# Patient Record
Sex: Female | Born: 1980 | Race: White | Hispanic: No | Marital: Single | Smoking: Light tobacco smoker
Health system: Southern US, Community
[De-identification: ages and names within clinical notes are randomized; demographics above are authoritative.]

## PROBLEM LIST (undated history)

## (undated) DIAGNOSIS — F419 Anxiety disorder, unspecified: Secondary | ICD-10-CM

## (undated) HISTORY — DX: Anxiety disorder, unspecified: F41.9

## (undated) HISTORY — PX: COLONOSCOPY: SHX174

---

## 2014-08-29 ENCOUNTER — Encounter (HOSPITAL_COMMUNITY): Payer: Self-pay | Admitting: Emergency Medicine

## 2014-08-29 ENCOUNTER — Emergency Department (HOSPITAL_COMMUNITY)
Admission: EM | Admit: 2014-08-29 | Discharge: 2014-08-29 | Disposition: A | Discharge status: Home / Self Care | Payer: Self-pay | Attending: Emergency Medicine | Admitting: Emergency Medicine

## 2014-08-29 DIAGNOSIS — M545 Low back pain, unspecified: Secondary | ICD-10-CM

## 2014-08-29 DIAGNOSIS — Z880 Allergy status to penicillin: Secondary | ICD-10-CM | POA: Insufficient documentation

## 2014-08-29 DIAGNOSIS — Y9289 Other specified places as the place of occurrence of the external cause: Secondary | ICD-10-CM | POA: Insufficient documentation

## 2014-08-29 DIAGNOSIS — IMO0002 Reserved for concepts with insufficient information to code with codable children: Secondary | ICD-10-CM | POA: Insufficient documentation

## 2014-08-29 DIAGNOSIS — J028 Acute pharyngitis due to other specified organisms: Secondary | ICD-10-CM

## 2014-08-29 DIAGNOSIS — Y93F2 Activity, caregiving, lifting: Secondary | ICD-10-CM | POA: Insufficient documentation

## 2014-08-29 DIAGNOSIS — B9689 Other specified bacterial agents as the cause of diseases classified elsewhere: Secondary | ICD-10-CM

## 2014-08-29 DIAGNOSIS — J029 Acute pharyngitis, unspecified: Secondary | ICD-10-CM | POA: Insufficient documentation

## 2014-08-29 DIAGNOSIS — S39012A Strain of muscle, fascia and tendon of lower back, initial encounter: Secondary | ICD-10-CM

## 2014-08-29 DIAGNOSIS — X500XXA Overexertion from strenuous movement or load, initial encounter: Secondary | ICD-10-CM | POA: Insufficient documentation

## 2014-08-29 LAB — RAPID STREP SCREEN (MED CTR MEBANE ONLY): Streptococcus, Group A Screen (Direct): NEGATIVE

## 2014-08-29 MED ORDER — DEXAMETHASONE SODIUM PHOSPHATE 10 MG/ML IJ SOLN
10.0000 mg | Freq: Once | INTRAMUSCULAR | Status: AC
  Administered 2014-08-29: 10 mg via INTRAMUSCULAR
  Filled 2014-08-29: qty 1

## 2014-08-29 MED ORDER — AZITHROMYCIN 250 MG PO TABS
500.0000 mg | ORAL_TABLET | Freq: Once | ORAL | Status: AC
  Administered 2014-08-29: 500 mg via ORAL
  Filled 2014-08-29: qty 2

## 2014-08-29 MED ORDER — METHOCARBAMOL 500 MG PO TABS: 500.0000 mg | ORAL_TABLET | Freq: Two times a day (BID) | ORAL | Status: DC

## 2014-08-29 MED ORDER — AZITHROMYCIN 250 MG PO TABS: 250.0000 mg | ORAL_TABLET | Freq: Every day | ORAL | Status: DC

## 2014-08-29 NOTE — Discharge Instructions (Signed)
1. Medications: azithromycin, robaxin, usual home medications 2. Treatment: rest, drink plenty of fluids, ibuprofen and tylenol for pain and fever control 3. Follow Up: Please followup with your primary doctor in 3 days for discussion of your diagnoses and further evaluation after today's visit; if you do not have a primary care doctor use the resource guide provided to find one; return to the emergency department for difficulty swallowing, difficulty breathing, high fevers or other concerns   Back Exercises Back exercises help treat and prevent back injuries. The goal of back exercises is to increase the strength of your abdominal and back muscles and the flexibility of your back. These exercises should be started when you no longer have back pain. Back exercises include:  Pelvic Tilt. Lie on your back with your knees bent. Tilt your pelvis until the lower part of your back is against the floor. Hold this position 5 to 10 sec and repeat 5 to 10 times.  Knee to Chest. Pull first 1 knee up against your chest and hold for 20 to 30 seconds, repeat this with the other knee, and then both knees. This may be done with the other leg straight or bent, whichever feels better.  Sit-Ups or Curl-Ups. Bend your knees 90 degrees. Start with tilting your pelvis, and do a partial, slow sit-up, lifting your trunk only 30 to 45 degrees off the floor. Take at least 2 to 3 seconds for each sit-up. Do not do sit-ups with your knees out straight. If partial sit-ups are difficult, simply do the above but with only tightening your abdominal muscles and holding it as directed.  Hip-Lift. Lie on your back with your knees flexed 90 degrees. Push down with your feet and shoulders as you raise your hips a couple inches off the floor; hold for 10 seconds, repeat 5 to 10 times.  Back arches. Lie on your stomach, propping yourself up on bent elbows. Slowly press on your hands, causing an arch in your low back. Repeat 3 to 5 times.  Any initial stiffness and discomfort should lessen with repetition over time.  Shoulder-Lifts. Lie face down with arms beside your body. Keep hips and torso pressed to floor as you slowly lift your head and shoulders off the floor. Do not overdo your exercises, especially in the beginning. Exercises may cause you some mild back discomfort which lasts for a few minutes; however, if the pain is more severe, or lasts for more than 15 minutes, do not continue exercises until you see your caregiver. Improvement with exercise therapy for back problems is slow.  See your caregivers for assistance with developing a proper back exercise program. Document Released: 01/01/2005 Document Revised: 02/16/2012 Document Reviewed: 09/25/2011 [facility] Patient Information 2015 [place], [place]. This information is not intended to replace advice given to you by your health care provider. Make sure you discuss any questions you have with your health care provider.   Pharyngitis Pharyngitis is redness, pain, and swelling (inflammation) of your pharynx.  CAUSES  Pharyngitis is usually caused by infection. Most of the time, these infections are from viruses (viral) and are part of a cold. However, sometimes pharyngitis is caused by bacteria (bacterial). Pharyngitis can also be caused by allergies. Viral pharyngitis may be spread from person to person by coughing, sneezing, and personal items or utensils (cups, forks, spoons, toothbrushes). Bacterial pharyngitis may be spread from person to person by more intimate contact, such as kissing.  SIGNS AND SYMPTOMS  Symptoms of pharyngitis include:  Sore throat.   Tiredness (fatigue).   Low-grade fever.   Headache.  Joint pain and muscle aches.  Skin rashes.  Swollen lymph nodes.  Plaque-like film on throat or tonsils (often seen with bacterial pharyngitis). DIAGNOSIS  Your health care provider will ask you questions about your illness and your symptoms. Your  medical history, along with a physical exam, is often all that is needed to diagnose pharyngitis. Sometimes, a rapid strep test is done. Other lab tests may also be done, depending on the suspected cause.  TREATMENT  Viral pharyngitis will usually get better in 3-4 days without the use of medicine. Bacterial pharyngitis is treated with medicines that kill germs (antibiotics).  HOME CARE INSTRUCTIONS   Drink enough water and fluids to keep your urine clear or pale yellow.   Only take over-the-counter or prescription medicines as directed by your health care provider:   If you are prescribed antibiotics, make sure you finish them even if you start to feel better.   Do not take aspirin.   Get lots of rest.   Gargle with 8 oz of salt water ( tsp of salt per 1 qt of water) as often as every 1-2 hours to soothe your throat.   Throat lozenges (if you are not at risk for choking) or sprays may be used to soothe your throat. SEEK MEDICAL CARE IF:   You have large, tender lumps in your neck.  You have a rash.  You cough up green, yellow-brown, or bloody spit. SEEK IMMEDIATE MEDICAL CARE IF:   Your neck becomes stiff.  You drool or are unable to swallow liquids.  You vomit or are unable to keep medicines or liquids down.  You have severe pain that does not go away with the use of recommended medicines.  You have trouble breathing (not caused by a stuffy nose). MAKE SURE YOU:   Understand these instructions.  Will watch your condition.  Will get help right away if you are not doing well or get worse. Document Released: 11/24/2005 Document Revised: 09/14/2013 Document Reviewed: 08/01/2013 [facility] Patient Information 2015 [place], [place]. This information is not intended to replace advice given to you by your health care provider. Make sure you discuss any questions you have with your health care provider.     Emergency Department Resource Guide 1) Find a Doctor and Pay  Out of Pocket Although you won't have to find out who is covered by your insurance plan, it is a good idea to ask around and get recommendations. You will then need to call the office and see if the doctor you have chosen will accept you as a new patient and what types of options they offer for patients who are self-pay. Some doctors offer discounts or will set up payment plans for their patients who do not have insurance, but you will need to ask so you aren't surprised when you get to your appointment.  2) Contact Your Local Health Department Not all health departments have doctors that can see patients for sick visits, but many do, so it is worth a call to see if yours does. If you don't know where your local health department is, you can check in your phone book. The CDC also has a tool to help you locate your state's health department, and many state websites also have listings of all of their local health departments.  3) Find a Walk-in Clinic If your illness is not likely to be very severe or complicated,  you may want to try a walk in clinic. These are popping up all over the country in pharmacies, drugstores, and shopping centers. They're usually staffed by nurse practitioners or physician assistants that have been trained to treat common illnesses and complaints. They're usually fairly quick and inexpensive. However, if you have serious medical issues or chronic medical problems, these are probably not your best option.  No Primary Care Doctor: - Call Health Connect at  [phone] - they can help you locate a primary care doctor that  accepts your insurance, provides certain services, etc. - Physician Referral Service- [phone]  Chronic Pain Problems: Organization         Address  Phone   Notes  [facility]  [phone] Patients need to be referred by their primary care doctor.   Medication Assistance: Organization         Address  Phone   Notes  [facility]  Medication [facility] [address] [place] [place], [place] [address] [phone] --Must be a resident of [facility] -- Must have NO insurance coverage whatsoever (no Medicaid/ Medicare, etc.) -- The pt. MUST have a primary care doctor that directs their care regularly and follows them in the community   [facility]  [phone]   [employer]  [phone]    Agencies that provide inexpensive medical care: Organization         Address  Phone   Notes  [facility]  [phone]   [facility]    [phone]   [facility] [address] [place], [place] [address] [phone]   [facility] [address], [place] [phone]   Planned Parenthood    [phone]   [facility]    [phone]   [facility]  [address], [place] Phone:  [phone], Fax:  [phone] Hours of Operation:  9 am - 6 pm, M-F.  Also accepts Medicaid/Medicare and self-pay.  [facility]  [address], [place] Phone: [phone], Fax: [phone]. Hours of Operation:  8:30 am - 5:30 pm, M-F.  Also accepts Medicaid and self-pay.  [facility] [place] [address] [place] Phone: [phone]   [facility] [address] Natasha Bence [place], [place] [phone], Ext. [phone] Mondays & Thursdays: 7-9 AM.  First 15 patients are seen on a first come, first serve basis.    Medicaid-accepting [facility] Providers:  Organization         Address  Phone   Notes  [facility] [address], [place] [phone] Also accepts self-pay patients.  [facility] [address] Laurell Josephs [place], [place]  [phone]   [facility] [address], [place] [phone]   [facility] Family Medicine [address] [place] [phone]   Renaye Rakers [address], [place]   [phone] Only accepts [place] Access [place] patients after they have their name applied to their card.   Self-Pay (no insurance) in [facility]:  Organization         Address  Phone   Notes  Sickle Cell Patients, [facility] Internal Medicine [address] [place], [place] [phone]   [facility] Care [address] [place],  [place] [phone]   [facility] [employer]  [address], [place] [phone]   [facility]/Dr. Osei-Bonsu  [address], [place] or [address], [place] [phone] Phone number for both [place] and [place] locations is the same.  [facility] [address], [place] [phone]   [facility] [address] [place] or [address] [phone]   [facility] [address], [place] [phone], phone; [phone], fax Sees patients 1st and 3rd Saturday of every month.  Must not qualify for public or private insurance (i.e. Medicaid, Medicare, [place] Health Choice, Veterans' Benefits)  Household income should be no more than 200% of the poverty level The clinic cannot treat you if you are pregnant or think you are pregnant  Sexually transmitted diseases are not treated at the clinic.    Dental Care: Organization         Address  Phone  Notes  [facility] [address] [place], [place] [phone] Accepts children up to age 13 who are enrolled in [place] or [place] Health Choice; pregnant women with a Medicaid card; and children who have applied for Medicaid or [place] Health Choice, but were declined, whose parents can pay a reduced fee at time of service.  [facility] Department of [facility]  [address], [place]  [phone] Accepts children up to age 47 who are enrolled in [place] or [place] Health Choice; pregnant women with a Medicaid card; and children who have applied for Medicaid or [place] Health Choice, but were declined, whose parents can pay a reduced fee at time of service.  [facility]  [address] [place], [place] [phone] Patients are seen by appointment only. Walk-ins are not accepted. [facility] will see patients 33 years of age and older. Monday - Tuesday (8am-5pm) Most Wednesdays (8:30-5pm) $30 per visit, cash only  [facility] Adult Dental Access PRO[facility]  [address], [facility] [phone] Patients are seen by appointment only. Walk-ins are not accepted. [facility] will see patients 4 years of age and older. One Wednesday Evening (Monthly: Volunteer Based).  $30 per visit, cash only  [employer]  [phone] for adults; Children under age 38, call Graduate Pediatric Dentistry at [phone]. Children aged 32-14, please call [phone] to request a pediatric application.  Dental services are provided in all areas of dental care including fillings, crowns and bridges, complete and partial dentures, implants, gum treatment, root canals, and extractions. Preventive care is also provided. Treatment is provided to both adults and children. Patients are selected via a lottery and there is often a waiting list.   [facility] [address], [place]  [phone] [email or web address]   [facility] [address] [place], [place] [phone], Ext. [phone] Second and Fourth Thursday of each month, opens at 6:30 AM; Clinic ends at 9 AM.  Patients are seen on a first-come first-served basis, and a limited number are seen during each clinic.   [facility]  [address] Ether Griffins [place], [place] [phone]   Eligibility Requirements You must have lived in [place], [place], or [place]  counties for at least the last three months.   You cannot be eligible  for state or [employer], including [employer], [place], or [employer].   You generally cannot be eligible for healthcare insurance through your employer.    How to apply: Eligibility screenings are held every Tuesday and Wednesday afternoon from 1:00 pm until 4:00 pm. You do not need an appointment for the interview!  [facility] [address], [place], [place] [phone]   [facility]  [phone]   [facility] Health Department  [phone]   [facility] Health Department  [phone]    Behavioral Health Resources in the Community: Intensive Outpatient Programs Organization         Address  Phone  Notes  [facility] Services [address], [place], [place] [phone]   [facility] Outpatient [address] [place], [place] [phone]   [facility] [address] [place], [place]  [phone]   [facility] Mental Health [address],  [place], [place] [phone] or [phone]   Substance Abuse Resources Organization         Address  Phone  Notes  [facility]  [phone]   [facility]  [phone]   [facility] [place]  [phone]   Floydene Flock  [phone]   Residential & Outpatient Substance Abuse Program  [phone]   Psychological Services Organization         Address  Phone  Notes  [facility] Behavioral Health  [phone]   [facility]  [phone]   [facility] Mental Health [address] [place] [phone] or [phone]    Mobile Crisis Teams Organization         Address  Phone  Notes  [facility], Mobile Crisis Care Unit  [phone]   [facility]  [address] [place], [place] [phone]   Doristine Locks [address] [place]  [place] [phone]    Self-Help/Support Groups Organization         Address  Phone             Notes  [facility] [place] - variety of support groups  336- I7437963 Call for more information  Narcotics Anonymous (NA), [facility] [address], [employer] [place]  2 meetings at this location   Statistician         Address  Phone  Notes  [facility] [address],    [place]  [phone]   [facility]  [address], [place] [address], [place], [place] [phone]   [facility] [address] [place], [place] [phone] Admissions: 8am-3pm M-F  [facility] [address],    [place], [place] [phone]   [facility] [address] Starling Manns [place], [place] [phone]   [facility] [address],  [place], [place] [phone]   [facility] - Intensive Outpatient [address] Laurell Joseph[address], [place], [place] [phone]   [facility] (Addiction [facility] [address] [place]  [place], [place] [phone] or [phone]   [facility] ([facility]) [address], [place], [place] [phone] Accepts Medicaid  [facility] [address],  [place] [phone] Substance Abuse/Addiction Treatment   [facility] Organization         Address  Phone  Notes  [facility]  [phone]   Angie Fava, PhD [address] [place], [place]   (  336) X3202989 or [phone]) [phone]   [facility]   [address] [place], [place] [phone]   [facility] Recovery [address] [place], [place] [phone] Insurance/Medicaid/sponsorship through [facility] and Families [address]                                    [place], [place] [phone] Therapy/tele-psych/case  [facility] [address]   [place], [place] [phone]    Dr. Lolly Mustache  [phone]   [facility]  [employer] [facility] Dept. 1) [address],  2) [address], [place] 3)  [address], [place] [phone]  [phone]   [facility] Child Abuse Hotline 714-497-3075 or [phone] (After Hours)

## 2014-08-29 NOTE — ED Provider Notes (Signed)
CSN: [id number]     Arrival date & time 08/29/14  2143 History   First MD Initiated Contact with Patient 08/29/14 2253     Chief Complaint  Patient presents with  . Sore Throat     (Consider location/radiation/quality/duration/timing/severity/associated sxs/prior Treatment) The history is provided by the patient and medical records. No language interpreter was used.    Lisa Lindsey is a 33 y.o. female  with no known medical Hx presents to the Emergency Department complaining of gradual, persistent, progressively worsening sore throat onset 3 days ago. Associated symptoms include otalgia, nasal congestion, chills.  Pt reports three small children at home with URIs.  Nothing makes it better and nothing makes it worse.  Pt denies fever, neck pain, chest pain, SOB, abd pain, N/V/D, weakness, dizziness, syncope.    Pt also c/o low back pain.  She was attempting to lift her mother and felt something pull in her back.  She reports she has been taking advil with moderate relief.  She denies loss of bowel or bladder control, saddle anesthesia or gait disturbance.  Denies hx of cancer, IVDU, anticoagulant use.     History reviewed. No pertinent past medical history. History reviewed. No pertinent past surgical history. History reviewed. No pertinent family history. History  Substance Use Topics  . Smoking status: Not on file  . Smokeless tobacco: Not on file  . Alcohol Use: Not on file   OB History   Grav Para Term Preterm Abortions TAB SAB Ect Mult Living                 Review of Systems  Constitutional: Negative for fever, chills and fatigue.  HENT: Positive for ear pain and sore throat.   Eyes: Negative for visual disturbance.  Respiratory: Negative for chest tightness and shortness of breath.   Cardiovascular: Negative for chest pain.  Gastrointestinal: Negative for nausea, vomiting, abdominal pain and diarrhea.  Endocrine: Negative for polydipsia, polyphagia and polyuria.   Genitourinary: Negative for dysuria, urgency, frequency and hematuria.  Musculoskeletal: Positive for back pain and gait problem ( 2/2 pain). Negative for joint swelling, neck pain and neck stiffness.  Skin: Negative for rash.  Allergic/Immunologic: Negative for immunocompromised state.  Neurological: Negative for weakness, light-headedness, numbness and headaches.  All other systems reviewed and are negative.     Allergies  Penicillins  Home Medications   Prior to Admission medications   Medication Sig Start Date End Date Taking? Authorizing Provider  azithromycin (ZITHROMAX) 250 MG tablet Take 1 tablet (250 mg total) by mouth daily. Take first 2 tablets together, then 1 every day until finished. 08/29/14   Zymiere Trostle, PA-C  methocarbamol (ROBAXIN) 500 MG tablet Take 1 tablet (500 mg total) by mouth 2 (two) times daily. 08/29/14   Nasri Boakye, PA-C   BP 126/70  Pulse 76  Temp(Src) 98.6 F (37 C) (Oral)  Resp 19  SpO2 100% Physical Exam  Nursing note and vitals reviewed. Constitutional: She appears well-developed and well-nourished. No distress.  HENT:  Head: Normocephalic and atraumatic.  Right Ear: Tympanic membrane, external ear and ear canal normal.  Left Ear: Tympanic membrane, external ear and ear canal normal.  Nose: Nose normal. No mucosal edema or rhinorrhea.  Mouth/Throat: Uvula is midline and mucous membranes are normal. Mucous membranes are not dry. No trismus in the jaw. No uvula swelling. Oropharyngeal exudate, posterior oropharyngeal edema and posterior oropharyngeal erythema present. No tonsillar abscesses.  Posterior oropharynx with erythema, edema and exudate on the tonsils  Eyes: Conjunctivae and EOM are normal. Pupils are equal, round, and reactive to light. No scleral icterus.  Neck: Normal range of motion, full passive range of motion without pain and phonation normal. Neck supple. No tracheal tenderness, no spinous process tenderness and no  muscular tenderness present. No rigidity. No erythema and normal range of motion present. No Brudzinski's sign and no Kernig's sign noted.  Range of motion without pain no No midline or paraspinal tenderness Normal phonation No stridor Handling secretions without difficulty No nuchal rigidity or meningeal signs  Cardiovascular: Normal rate, regular rhythm, normal heart sounds and intact distal pulses.   No murmur heard. Pulses:      Radial pulses are 2+ on the right side, and 2+ on the left side.  Pulmonary/Chest: Effort normal and breath sounds normal. No stridor. No respiratory distress. She has no decreased breath sounds. She has no wheezes.  Equal chest expansion, clear and equal breath sounds without focal wheezes, rhonchi or rales  Abdominal: Soft. She exhibits no distension. There is no tenderness.  Musculoskeletal: Normal range of motion.  Full range of motion of the T-spine and L-spine No tenderness to palpation of the spinous processes of the T-spine or L-spine Mild tenderness to palpation of the right paraspinous muscles of the L-spine  Lymphadenopathy:       Head (right side): Submandibular and tonsillar adenopathy present. No submental, no preauricular, no posterior auricular and no occipital adenopathy present.       Head (left side): Submandibular and tonsillar adenopathy present. No submental, no preauricular, no posterior auricular and no occipital adenopathy present.    She has cervical adenopathy (anterior, bilateral).       Right cervical: No superficial cervical, no deep cervical and no posterior cervical adenopathy present.      Left cervical: No superficial cervical, no deep cervical and no posterior cervical adenopathy present.  Neurological: She is alert. She has normal reflexes. She exhibits normal muscle tone. Coordination normal.  Speech is clear and goal oriented, follows commands Normal 5/5 strength in upper and lower extremities bilaterally including  dorsiflexion and plantar flexion, strong and equal grip strength Sensation normal to light and sharp touch Moves extremities without ataxia, coordination intact Normal gait Normal balance   Skin: Skin is warm and dry. No rash noted. She is not diaphoretic. No erythema.  Psychiatric: She has a normal mood and affect. Her behavior is normal.    ED Course  Procedures (including critical care time) Labs Review Labs Reviewed  RAPID STREP SCREEN  CULTURE, GROUP A STREP    Imaging Review No results found.   EKG Interpretation None      MDM   Final diagnoses:  Bacterial pharyngitis  Right-sided low back pain without sciatica  Lumbar strain, initial encounter   Minna Freels presents with hx and PE consistent with bacterial pharyngitis and low back strain.    Pt febrile with tonsillar exudate, cervical lymphadenopathy, & dysphagia; diagnosis of arterial pharyngitis. Treated in the ED with steroids, NSAIDs, Pain medication and discharged with azithromycin.  Pt appears mildly dehydrated, discussed importance of water rehydration. Presentation non concerning for PTA or infxn spread to soft tissue. No trismus or uvula deviation. Specific return precautions discussed. Pt able to drink water in ED without difficulty with intact air way.   Patient with back pain.  No neurological deficits and normal neuro exam.  Patient can walk but states is painful.  No loss of bowel or bladder control.  No concern for cauda  equina.  No fever, night sweats, weight loss, h/o cancer, IVDU.  RICE protocol and muscle relaxers indicated and discussed with patient.    Recommend primary care followup  BP 126/70  Pulse 76  Temp(Src) 98.6 F (37 C) (Oral)  Resp 19  SpO2 100%     Dierdre Forth, PA-C 08/29/14 2336

## 2014-08-29 NOTE — ED Notes (Signed)
Pt also wants to be seen for back pain.  Pt was pulling her mother out of bed yesterday and felt back hurting.

## 2014-08-29 NOTE — ED Notes (Signed)
Pt reports yellow patches and reddened throat and throat pain starting Sunday. Denies fevers.

## 2014-08-30 NOTE — Progress Notes (Signed)
  CARE MANAGEMENT ED NOTE 08/30/2014  Patient:  Lisa Lindsey   Account Number:  [id number]  Date Initiated:  08/30/2014  Documentation initiated by:  Radford Pax  Subjective/Objective Assessment:   Patient presents to [facility] Ed with sore throat worsening for three days.  Patient has three small children at home with upper respiratoey infections.     Subjective/Objective Assessment Detail:     Action/Plan:   Action/Plan Detail:   Anticipated DC Date:  08/30/2014     Status Recommendation to Physician:   Result of Recommendation:      DC Planning Services  CM consult  Other  Medication Assistance    Choice offered to / List presented to:            Status of service:  Completed, signed off  ED Comments:   ED Comments Detail:  [facility] received phone call from patient requesting assistance with prescription cost.  Patient reports she went to [place] pharmacy and meication cost  fourty five dollars. Patient unable to afford prescription cost.  [facility] entered patient into the [facility] program.  [facility] explained to patient that she has seven days to fill her prescription, otherwise the [facility] letter will expire.  Also informed patient that this is a one time assist and she will not be eligible for this assistance until this time next year Sept 2016. Patient is agreeable to three dollar copay. Patient wants to get her RX filled at [place] [facility] [address]. [facility] provided patient with phone number and adress to [facility] [phone].  [facility] called [facility] pharmacy and received FAX number [phone].  [facility] faxed MATCH letter to [facility] at 1813pm with confirmation of receipt at 1814pm.  [facility] informed patient of above.  No further EDCM needs at this time.

## 2014-08-30 NOTE — ED Provider Notes (Signed)
Medical screening examination/treatment/procedure(s) were performed by non-physician practitioner and as supervising physician I was immediately available for consultation/collaboration.   EKG Interpretation None      Devoria Albe, MD, Armando Gang   Ward Givens, MD 08/30/14 [phone]

## 2014-08-30 NOTE — Progress Notes (Signed)
08/30/2014 A. Lisa Lindsey RNCM 1850pm EDCM called [facility] pharmacy who confirms they have received MATCH letter.  Per previos converstion with patient, she reported her pcp is located at [facility] in [place].  System updated.  [facility] encouraged patient to make follow up appointment with her pcp.  Patient verbalized understanding.  No further EDCM needs at this time.

## 2014-09-01 LAB — CULTURE, GROUP A STREP

## 2015-02-06 ENCOUNTER — Emergency Department (HOSPITAL_COMMUNITY): Payer: Medicaid Other

## 2015-02-06 ENCOUNTER — Inpatient Hospital Stay (HOSPITAL_COMMUNITY): Payer: Medicaid Other

## 2015-02-06 ENCOUNTER — Inpatient Hospital Stay (HOSPITAL_COMMUNITY)
Admission: EM | Admit: 2015-02-06 | Discharge: 2015-02-10 | DRG: 871 | Disposition: A | Discharge status: Home / Self Care | Payer: Medicaid Other | Attending: Internal Medicine | Admitting: Internal Medicine

## 2015-02-06 DIAGNOSIS — R112 Nausea with vomiting, unspecified: Secondary | ICD-10-CM

## 2015-02-06 DIAGNOSIS — Z6841 Body Mass Index (BMI) 40.0 and over, adult: Secondary | ICD-10-CM | POA: Diagnosis not present

## 2015-02-06 DIAGNOSIS — E860 Dehydration: Secondary | ICD-10-CM | POA: Diagnosis present

## 2015-02-06 DIAGNOSIS — R6521 Severe sepsis with septic shock: Secondary | ICD-10-CM | POA: Diagnosis present

## 2015-02-06 DIAGNOSIS — A084 Viral intestinal infection, unspecified: Secondary | ICD-10-CM | POA: Diagnosis present

## 2015-02-06 DIAGNOSIS — R1010 Upper abdominal pain, unspecified: Secondary | ICD-10-CM

## 2015-02-06 DIAGNOSIS — R109 Unspecified abdominal pain: Secondary | ICD-10-CM

## 2015-02-06 DIAGNOSIS — A09 Infectious gastroenteritis and colitis, unspecified: Secondary | ICD-10-CM

## 2015-02-06 DIAGNOSIS — A419 Sepsis, unspecified organism: Secondary | ICD-10-CM | POA: Diagnosis present

## 2015-02-06 DIAGNOSIS — R509 Fever, unspecified: Secondary | ICD-10-CM

## 2015-02-06 DIAGNOSIS — R197 Diarrhea, unspecified: Secondary | ICD-10-CM

## 2015-02-06 DIAGNOSIS — Z452 Encounter for adjustment and management of vascular access device: Secondary | ICD-10-CM

## 2015-02-06 DIAGNOSIS — E876 Hypokalemia: Secondary | ICD-10-CM | POA: Diagnosis present

## 2015-02-06 DIAGNOSIS — N179 Acute kidney failure, unspecified: Secondary | ICD-10-CM | POA: Diagnosis present

## 2015-02-06 DIAGNOSIS — E869 Volume depletion, unspecified: Secondary | ICD-10-CM | POA: Diagnosis present

## 2015-02-06 DIAGNOSIS — N170 Acute kidney failure with tubular necrosis: Secondary | ICD-10-CM | POA: Diagnosis present

## 2015-02-06 LAB — URINALYSIS, ROUTINE W REFLEX MICROSCOPIC
Glucose, UA: 100 mg/dL — AB
Ketones, ur: 15 mg/dL — AB
NITRITE: POSITIVE — AB
PH: 5 (ref 5.0–8.0)
PROTEIN: 100 mg/dL — AB
SPECIFIC GRAVITY, URINE: 1.024 (ref 1.005–1.030)
Urobilinogen, UA: 1 mg/dL (ref 0.0–1.0)

## 2015-02-06 LAB — COMPREHENSIVE METABOLIC PANEL
ALK PHOS: 79 U/L (ref 39–117)
ALT: 27 U/L (ref 0–35)
AST: 74 U/L — ABNORMAL HIGH (ref 0–37)
Albumin: 3.4 g/dL — ABNORMAL LOW (ref 3.5–5.2)
Anion gap: 13 (ref 5–15)
BILIRUBIN TOTAL: 0.9 mg/dL (ref 0.3–1.2)
BUN: 25 mg/dL — ABNORMAL HIGH (ref 6–23)
CO2: 19 mmol/L (ref 19–32)
Calcium: 8.5 mg/dL (ref 8.4–10.5)
Chloride: 101 mmol/L (ref 96–112)
Creatinine, Ser: 4.23 mg/dL — ABNORMAL HIGH (ref 0.50–1.10)
GFR calc Af Amer: 15 mL/min — ABNORMAL LOW (ref >90)
GFR calc non Af Amer: 13 mL/min — ABNORMAL LOW (ref >90)
GLUCOSE: 119 mg/dL — AB (ref 70–99)
POTASSIUM: 3.5 mmol/L (ref 3.5–5.1)
Sodium: 133 mmol/L — ABNORMAL LOW (ref 135–145)
Total Protein: 7.3 g/dL (ref 6.0–8.3)

## 2015-02-06 LAB — I-STAT CG4 LACTIC ACID, ED
LACTIC ACID, VENOUS: 3.05 mmol/L — AB (ref 0.5–2.0)
Lactic Acid, Venous: 4.28 mmol/L (ref 0.5–2.0)

## 2015-02-06 LAB — PROCALCITONIN

## 2015-02-06 LAB — CBC
HCT: 40.4 % (ref 36.0–46.0)
Hemoglobin: 13.4 g/dL (ref 12.0–15.0)
MCH: 27.7 pg (ref 26.0–34.0)
MCHC: 33.2 g/dL (ref 30.0–36.0)
MCV: 83.5 fL (ref 78.0–100.0)
PLATELETS: 191 10*3/uL (ref 150–400)
RBC: 4.84 MIL/uL (ref 3.87–5.11)
RDW: 15.9 % — ABNORMAL HIGH (ref 11.5–15.5)
WBC: 20.9 10*3/uL — AB (ref 4.0–10.5)

## 2015-02-06 LAB — BASIC METABOLIC PANEL
ANION GAP: 12 (ref 5–15)
BUN: 24 mg/dL — ABNORMAL HIGH (ref 6–23)
CALCIUM: 7 mg/dL — AB (ref 8.4–10.5)
CO2: 18 mmol/L — ABNORMAL LOW (ref 19–32)
CREATININE: 3.84 mg/dL — AB (ref 0.50–1.10)
Chloride: 106 mmol/L (ref 96–112)
GFR calc Af Amer: 16 mL/min — ABNORMAL LOW (ref >90)
GFR, EST NON AFRICAN AMERICAN: 14 mL/min — AB (ref >90)
Glucose, Bld: 95 mg/dL (ref 70–99)
Potassium: 3.3 mmol/L — ABNORMAL LOW (ref 3.5–5.1)
Sodium: 136 mmol/L (ref 135–145)

## 2015-02-06 LAB — LIPASE, BLOOD: Lipase: 21 U/L (ref 11–59)

## 2015-02-06 LAB — URINE MICROSCOPIC-ADD ON

## 2015-02-06 LAB — POC URINE PREG, ED: PREG TEST UR: NEGATIVE

## 2015-02-06 MED ORDER — SODIUM CHLORIDE 0.9 % IV BOLUS (SEPSIS)
1000.0000 mL | Freq: Once | INTRAVENOUS | Status: AC
  Administered 2015-02-06: 1000 mL via INTRAVENOUS

## 2015-02-06 MED ORDER — LEVOFLOXACIN IN D5W 750 MG/150ML IV SOLN
750.0000 mg | Freq: Once | INTRAVENOUS | Status: AC
  Administered 2015-02-06: 750 mg via INTRAVENOUS
  Filled 2015-02-06: qty 150

## 2015-02-06 MED ORDER — DEXTROSE 5 % IV SOLN
1.0000 g | Freq: Three times a day (TID) | INTRAVENOUS | Status: DC
  Administered 2015-02-07 – 2015-02-08 (×4): 1 g via INTRAVENOUS
  Filled 2015-02-06 (×6): qty 1

## 2015-02-06 MED ORDER — HEPARIN SODIUM (PORCINE) 5000 UNIT/ML IJ SOLN
5000.0000 [IU] | Freq: Three times a day (TID) | INTRAMUSCULAR | Status: DC
  Administered 2015-02-07 – 2015-02-10 (×12): 5000 [IU] via SUBCUTANEOUS
  Filled 2015-02-06 (×14): qty 1

## 2015-02-06 MED ORDER — PANTOPRAZOLE SODIUM 40 MG IV SOLR
40.0000 mg | Freq: Every day | INTRAVENOUS | Status: DC
  Administered 2015-02-07 – 2015-02-08 (×3): 40 mg via INTRAVENOUS
  Filled 2015-02-06 (×5): qty 40

## 2015-02-06 MED ORDER — METRONIDAZOLE IN NACL 5-0.79 MG/ML-% IV SOLN
500.0000 mg | Freq: Three times a day (TID) | INTRAVENOUS | Status: DC
  Administered 2015-02-07 (×2): 500 mg via INTRAVENOUS
  Filled 2015-02-06 (×3): qty 100

## 2015-02-06 MED ORDER — DEXTROSE 5 % IV SOLN
5.0000 ug/min | INTRAVENOUS | Status: DC
  Administered 2015-02-07: 5 ug/min via INTRAVENOUS
  Administered 2015-02-07: 40 ug/min via INTRAVENOUS
  Filled 2015-02-06 (×2): qty 4

## 2015-02-06 MED ORDER — AZTREONAM 2 G IJ SOLR
2.0000 g | Freq: Once | INTRAMUSCULAR | Status: AC
  Administered 2015-02-06: 2 g via INTRAVENOUS
  Filled 2015-02-06: qty 2

## 2015-02-06 MED ORDER — POTASSIUM CHLORIDE 10 MEQ/50ML IV SOLN
10.0000 meq | INTRAVENOUS | Status: AC
  Administered 2015-02-07 (×3): 10 meq via INTRAVENOUS
  Filled 2015-02-06 (×4): qty 50

## 2015-02-06 MED ORDER — ONDANSETRON 4 MG PO TBDP
4.0000 mg | ORAL_TABLET | Freq: Once | ORAL | Status: AC
  Administered 2015-02-06: 4 mg via ORAL

## 2015-02-06 MED ORDER — VANCOMYCIN HCL IN DEXTROSE 1-5 GM/200ML-% IV SOLN
1000.0000 mg | Freq: Once | INTRAVENOUS | Status: AC
  Administered 2015-02-06: 1000 mg via INTRAVENOUS
  Filled 2015-02-06: qty 200

## 2015-02-06 MED ORDER — PHENYLEPHRINE HCL 10 MG/ML IJ SOLN
0.0000 ug/min | INTRAVENOUS | Status: DC
  Administered 2015-02-06: 50 ug/min via INTRAVENOUS
  Filled 2015-02-06: qty 1

## 2015-02-06 MED ORDER — VANCOMYCIN HCL IN DEXTROSE 750-5 MG/150ML-% IV SOLN
750.0000 mg | INTRAVENOUS | Status: AC
  Filled 2015-02-06: qty 150

## 2015-02-06 MED ORDER — SODIUM CHLORIDE 0.9 % IV SOLN: INTRAVENOUS | Status: DC

## 2015-02-06 MED ORDER — FLUCONAZOLE IN SODIUM CHLORIDE 200-0.9 MG/100ML-% IV SOLN
200.0000 mg | INTRAVENOUS | Status: DC
  Administered 2015-02-07: 200 mg via INTRAVENOUS
  Filled 2015-02-06 (×2): qty 100

## 2015-02-06 MED ORDER — HYDROCORTISONE NA SUCCINATE PF 100 MG IJ SOLR
50.0000 mg | Freq: Four times a day (QID) | INTRAMUSCULAR | Status: DC
  Administered 2015-02-07 – 2015-02-08 (×6): 50 mg via INTRAVENOUS
  Filled 2015-02-06 (×10): qty 1

## 2015-02-06 MED ORDER — SODIUM CHLORIDE 0.9 % IV BOLUS (SEPSIS): 500.0000 mL | INTRAVENOUS | Status: DC | PRN

## 2015-02-06 MED ORDER — LEVOFLOXACIN IN D5W 500 MG/100ML IV SOLN: 500.0000 mg | INTRAVENOUS | Status: DC

## 2015-02-06 MED ORDER — SODIUM CHLORIDE 0.9 % IV SOLN: 250.0000 mL | INTRAVENOUS | Status: DC | PRN

## 2015-02-06 MED ORDER — ONDANSETRON HCL 4 MG/2ML IJ SOLN
4.0000 mg | Freq: Once | INTRAMUSCULAR | Status: DC
  Filled 2015-02-06: qty 2

## 2015-02-06 MED ORDER — VANCOMYCIN HCL 10 G IV SOLR: 1750.0000 mg | INTRAVENOUS | Status: DC

## 2015-02-06 NOTE — Progress Notes (Signed)
ANTIBIOTIC CONSULT NOTE - INITIAL  Pharmacy Consult for Vanco, Levaquin, Aztreonam Indication: rule out sepsis  Allergies  Allergen Reactions  . Penicillins     Patient Measurements: Height: 5\' 7"  (170.2 cm) Weight: 270 lb (122.471 kg) IBW/kg (Calculated) : 61.6 Adjusted Body Weight:    Vital Signs: Temp: 98.7 F (37.1 C) (03/01 1752) Temp Source: Oral (03/01 1752) BP: 94/68 mmHg (03/01 1930) Pulse Rate: 34 (03/01 1853) Intake/Output from previous day:   Intake/Output from this shift:    Labs:  Recent Labs  02/06/15 1813  WBC 20.9*  HGB 13.4  PLT 191  CREATININE 4.23*   Estimated Creatinine Clearance: 25.2 mL/min (by C-G formula based on Cr of 4.23). No results for input(s): VANCOTROUGH, VANCOPEAK, VANCORANDOM, GENTTROUGH, GENTPEAK, GENTRANDOM, TOBRATROUGH, TOBRAPEAK, TOBRARND, AMIKACINPEAK, AMIKACINTROU, AMIKACIN in the last 72 hours.   Microbiology: No results found for this or any previous visit (from the past 720 hour(s)).  Medical History: No past medical history on file.  Medications:  See med rec  Assessment: N/V/D with syncopal episode 34 y/o F presents to ED via EMS with above complaints. Code sepsis. PMH unknown at this time except morbid obesity. Lactic acid 4.28 elevated. WBC 20.9 elevated. Scr 4.23 elevated. AST 74 elevated.    Goal of Therapy:  Vancomycin trough level 15-20 mcg/ml  Plan:  Aztreonam 2g in ED, then 1g IV q8hr Levaquin 750mg  IV in ED, then 500mg  IV q48h Vanco 1g IV in ED. Needs 1750mg  IV q48h. Give extra 750mg  IV now. F/u Renal function to dose/interval adjustments.    Chayna Surratt S. Merilynn Finlandobertson, PharmD, BCPS Clinical Staff Pharmacist Pager [phone]  Misty Stanleyobertson, Tiombe Tomeo Stillinger 02/06/2015,7:52 PM

## 2015-02-06 NOTE — ED Notes (Signed)
Verified abx for allergy compatibility with pharmacy, all rx okay with [facility] allergy.

## 2015-02-06 NOTE — ED Notes (Signed)
Helped pt call husband, [phone].

## 2015-02-06 NOTE — ED Notes (Addendum)
Partner Lisa Lindsey,  Mother-in-law Lisa Lindsey has 3 children, [phone]

## 2015-02-06 NOTE — ED Provider Notes (Addendum)
CSN: [id number]     Arrival date & time 02/06/15  1732 History   First MD Initiated Contact with Patient 02/06/15 1812     Chief Complaint  Patient presents with  . Emesis     (Consider location/radiation/quality/duration/timing/severity/associated sxs/prior Treatment) Patient is a 34 y.o. female presenting with vomiting. The history is provided by the patient.  Emesis Associated symptoms: diarrhea   Associated symptoms: no chills, no headaches and no sore throat   pt with nausea, vomiting, and diarrhea, for the past 2 days. Several episodes of each. Emesis not bloody or bilious. Diarrhea watery, not bloody. Denies hx same. Intermittent mild abd cramping, but no constant or focal abd pain. No dysuria, and urinating less frequently/less amount compared to normal. No vaginal discharge or bleeding. Denies flank or back pain. subj fever. No sweats. No cough, sore throat or uri c/o. No headache. No neck pain or stiffness. No recent abx use,  known bad food ingestion, or travel. +family members w recent gi symptoms. No rash. Today felt generally weak/faint, w ?brief syncopal event while sitting on porch swing - denies fall/injury.     No past medical history on file. No past surgical history on file. No family history on file. History  Substance Use Topics  . Smoking status: Not on file  . Smokeless tobacco: Not on file  . Alcohol Use: Not on file   OB History    No data available     Review of Systems  Constitutional: Negative for chills.  HENT: Negative for sinus pressure and sore throat.   Eyes: Negative for pain, redness and visual disturbance.  Respiratory: Negative for cough and shortness of breath.   Cardiovascular: Negative for chest pain and leg swelling.  Gastrointestinal: Positive for nausea, vomiting and diarrhea.  Endocrine: Negative for polyuria.  Genitourinary: Negative for dysuria, flank pain, vaginal bleeding and vaginal discharge.  Musculoskeletal: Negative for  back pain, neck pain and neck stiffness.  Skin: Negative for rash.  Neurological: Negative for headaches.  Hematological: Does not bruise/bleed easily.  Psychiatric/Behavioral: Negative for confusion.      Allergies  Penicillins  Home Medications   Prior to Admission medications   Medication Sig Start Date End Date Taking? Authorizing Provider  azithromycin (ZITHROMAX) 250 MG tablet Take 1 tablet (250 mg total) by mouth daily. Take first 2 tablets together, then 1 every day until finished. 08/29/14   Hannah Muthersbaugh, PA-C  methocarbamol (ROBAXIN) 500 MG tablet Take 1 tablet (500 mg total) by mouth 2 (two) times daily. 08/29/14   Hannah Muthersbaugh, PA-C   BP 94/68 mmHg  Pulse 34  Temp(Src) 98.7 F (37.1 C) (Oral)  Resp 22  Ht  (1.702 m)  Wt 270 lb (122.471 kg)  BMI 42.28 kg/m2  SpO2 100% Physical Exam  Constitutional: She is oriented to person, place, and time. She appears well-developed. She appears distressed.  Tachycardic.   HENT:  Head: Atraumatic.  Dry mm.   Eyes: Conjunctivae are normal. Pupils are equal, round, and reactive to light. No scleral icterus.  Neck: Normal range of motion. Neck supple. No tracheal deviation present. No thyromegaly present.  No stiffness or rigidity  Cardiovascular: Normal heart sounds and intact distal pulses.   Tachycardic.   Pulmonary/Chest: Effort normal and breath sounds normal. No respiratory distress.  Abdominal: Soft. Normal appearance and bowel sounds are normal. She exhibits no distension and no mass. There is no tenderness. There is no rebound and no guarding.  Genitourinary:  No cva  tenderness  Musculoskeletal: She exhibits no edema.  Neurological: She is alert and oriented to person, place, and time.  Skin: Skin is warm and dry. No rash noted.  No petechia or rash.   Psychiatric: She has a normal mood and affect.  Nursing note and vitals reviewed.   ED Course  Procedures (including critical care time) Labs  Review  Results for orders placed or performed during the hospital encounter of 02/06/15  CBC  Result Value Ref Range   WBC 20.9 (H) 4.0 - 10.5 K/uL   RBC 4.84 3.87 - 5.11 MIL/uL   Hemoglobin 13.4 12.0 - 15.0 g/dL   HCT 16.1 09.6 - 04.5 %   MCV 83.5 78.0 - 100.0 fL   MCH 27.7 26.0 - 34.0 pg   MCHC 33.2 30.0 - 36.0 g/dL   RDW 40.9 (H) 81.1 - 91.4 %   Platelets 191 150 - 400 K/uL  Comprehensive metabolic panel  Result Value Ref Range   Sodium 133 (L) 135 - 145 mmol/L   Potassium 3.5 3.5 - 5.1 mmol/L   Chloride 101 96 - 112 mmol/L   CO2 19 19 - 32 mmol/L   Glucose, Bld 119 (H) 70 - 99 mg/dL   BUN 25 (H) 6 - 23 mg/dL   Creatinine, Ser 7.82 (H) 0.50 - 1.10 mg/dL   Calcium 8.5 8.4 - 95.6 mg/dL   Total Protein 7.3 6.0 - 8.3 g/dL   Albumin 3.4 (L) 3.5 - 5.2 g/dL   AST 74 (H) 0 - 37 U/L   ALT 27 0 - 35 U/L   Alkaline Phosphatase 79 39 - 117 U/L   Total Bilirubin 0.9 0.3 - 1.2 mg/dL   GFR calc non Af Amer 13 (L) >90 mL/min   GFR calc Af Amer 15 (L) >90 mL/min   Anion gap 13 5 - 15  I-Stat CG4 Lactic Acid, ED  Result Value Ref Range   Lactic Acid, Venous 4.28 (HH) 0.5 - 2.0 mmol/L   Comment NOTIFIED PHYSICIAN        EKG Interpretation   Date/Time:  Tuesday February 06 2015 17:58:07 EST Ventricular Rate:  141 PR Interval:  128 QRS Duration: 82 QT Interval:  264 QTC Calculation: 404 R Axis:   100 Text Interpretation:  Sinus tachycardia Rightward axis ST \\T \ T wave  abnormality, consider inferior ischemia Baseline wander Confirmed by  Denton Lank  MD, Caryn Bee ([id number]) on 02/06/2015 6:08:03 PM      MDM   Iv ns boluses. Labs.   bp low, additional iv lines placed, pt w 3 iv lines. additional ivf.  Lactic acid high, wbc elevated, AKI, code sepsis, 30 ml/kg ns, and iv abx ordered.  Recheck abd soft nt. No focal tenderness or peritoneal signs.   Additional fluids. bp slightly improved from prior.   Recheck bp 94/68, pt feels mildly improved.   Reviewed nursing notes and prior  charts for additional history.   CRITICAL CARE  RE hypotension, tachycardia, acute kidney injury, dehydration, vomiting and diarrhea, weakness/syncope, r/o sepsis Performed by: Suzi Roots Total critical care time: 45 Critical care time was exclusive of separately billable procedures and treating other patients. Critical care was necessary to treat or prevent imminent or life-threatening deterioration. Critical care was time spent personally by me on the following activities: development of treatment plan with patient and/or surrogate as well as nursing, discussions with consultants, evaluation of patient's response to treatment, examination of patient, obtaining history from patient or surrogate, ordering and performing treatments and  interventions, ordering and review of laboratory studies, ordering and review of radiographic studies, pulse oximetry and re-evaluation of patient's condition.   Additional fluids.   abd soft mild upper abd tenderness.  No rebound or guarding. Pt denies hx gallstones, no prior abd surgery.   Critical care called for admission - discuss pt, labs incl wbc, lactate, aki, current bp - he will have Dr Tyson AliasFeinstein see in ED and admit to icu.   Recheck pt, no new symptoms.mentating normally. abd soft no peritonitis.   bp had improved to 101/42, then trended back down.  Pt c/o diffuse achiness. Dull headache, frontal. No nuchal rigidity on initial and repeat exams.  On repeat abd exam, increased upper quadrant tenderness, esp ruq. No rebound. Ct abd ordered without contrast given cr and critical illness.  Critical care team w pt, placing central line. Pt awaiting ct - will try to get bedside abd u/s, as ct pending.   After transfer to icu, ED nurse indicates on placing urinary cath earlier, moderate vaginal discharge/irritation.  Pt had no c/o lower abd pain or pelvic tenderness earlier..  I discussed above w icu team, Dr Tyson AliasFeinstein, at 0030, so that Icu care team  could check, r/o pelvic infxn/pid, as part of septic workup.      Suzi RootsKevin E Whyatt Klinger, MD 02/07/15 [phone]

## 2015-02-06 NOTE — H&P (Signed)
PULMONARY / CRITICAL CARE MEDICINE   Name: Lisa Lindsey MRN: [id number] DOB: 10-Jan-1981    ADMISSION DATE:  02/06/2015 CONSULTATION DATE:  02/06/2015  REFERRING MD :  EDP  CHIEF COMPLAINT:  Abd pain  INITIAL PRESENTATION: 34 year old female with several day history of abdominal pain/nausea/vomiting. In ED she was profoundly hypotensive refractory to aggressive IVF resuscitation. PCCM to admit   STUDIES:  CT abd >>  SIGNIFICANT EVENTS:  HISTORY OF PRESENT ILLNESS:  34 year female with no significant PMH presented to [facility] ED complaining of abdominal pain/nausea/vomiting for several days. She has 2 children who were recently sick with GI viral infections, which have since resolved. It started with emesis which was reported to be bloody at times. This eventually progressed to diarrhea. Abdominal pain is generalized with localization to RUQ and some radiation to back. In ED she was found to be profoundly hypotensive despite large volume IVF resuscitation. Lactic acid and SCr elevated. PCCM to admit.  PAST MEDICAL HISTORY :   has no past medical history on file.  has no past surgical history on file. Prior to Admission medications   Medication Sig Start Date End Date Taking? Authorizing Provider  levonorgestrel (MIRENA) 20 MCG/24HR IUD 1 each by Intrauterine route once. June 2015   Yes Historical Provider, MD  azithromycin (ZITHROMAX) 250 MG tablet Take 1 tablet (250 mg total) by mouth daily. Take first 2 tablets together, then 1 every day until finished. Patient not taking: Reported on 02/06/2015 08/29/14   Dahlia Client Muthersbaugh, PA-C  methocarbamol (ROBAXIN) 500 MG tablet Take 1 tablet (500 mg total) by mouth 2 (two) times daily. Patient not taking: Reported on 02/06/2015 08/29/14   Dahlia Client Muthersbaugh, PA-C   Allergies  Allergen Reactions  . Penicillins Other (See Comments)    childhood    FAMILY HISTORY:  has no family status information on file.  SOCIAL HISTORY:    REVIEW OF SYSTEMS:    Bolds are positive  Constitutional: weight loss, gain, night sweats, Fevers, chills, fatigue .  HEENT: headaches, Sore throat, sneezing, nasal congestion, post nasal drip, Difficulty swallowing, Tooth/dental problems, visual complaints visual changes, ear ache CV:  chest pain, radiates: ,Orthopnea, PND, swelling in lower extremities, dizziness, palpitations, syncope.  GI  heartburn, indigestion, abdominal pain, nausea, vomiting, diarrhea, change in bowel habits, loss of appetite, bloody stools.  Resp: cough, productive: , hemoptysis, dyspnea, chest pain, pleuritic.  Skin: rash or itching or icterus GU: dysuria, change in color of urine, urgency or frequency. flank pain, hematuria  MS: joint pain or swelling. decreased range of motion  Psych: change in mood or affect. depression or anxiety.  Neuro: difficulty with speech, weakness, numbness, ataxia    SUBJECTIVE:   VITAL SIGNS: Temp:  [98.7 F (37.1 C)-103.7 F (39.8 C)] 103.7 F (39.8 C) (03/01 1955) Pulse Rate:  [34-143] 130 (03/01 2205) Resp:  [18-34] 22 (03/01 2205) BP: (52-113)/(28-68) 76/43 mmHg (03/01 2205) SpO2:  [94 %-100 %] 99 % (03/01 2205) Weight:  [122.471 kg (270 lb)-260.818 kg (575 lb)] 122.471 kg (270 lb) (03/01 1856) HEMODYNAMICS:   VENTILATOR SETTINGS:   INTAKE / OUTPUT: No intake or output data in the 24 hours ending 02/06/15 2219  PHYSICAL EXAMINATION: General:  Obese female in distress  Neuro:  Alert, oriented, non focal HEENT:  [place]/AT, PERRL, no JVD noted Cardiovascular:  RRR, no MRG Lungs:  Clear bilateral breath sounds , tachypnea Abdomen:  Soft, diffusely tender with guarding, non-distended, no rebound Musculoskeletal:  No acute deformity Skin:  Grossly intact  LABS:  CBC  Recent Labs Lab 02/06/15 1813  WBC 20.9*  HGB 13.4  HCT 40.4  PLT 191   Coag's No results for input(s): APTT, INR in the last 168 hours. BMET  Recent Labs Lab 02/06/15 1813  NA 133*  K 3.5  CL 101  CO2 19   BUN 25*  CREATININE 4.23*  GLUCOSE 119*   Electrolytes  Recent Labs Lab 02/06/15 1813  CALCIUM 8.5   Sepsis Markers  Recent Labs Lab 02/06/15 1813 02/06/15 1840 02/06/15 2106  LATICACIDVEN  --  4.28* 3.05*  PROCALCITON >200.00  --   --    ABG No results for input(s): PHART, PCO2ART, PO2ART in the last 168 hours. Liver Enzymes  Recent Labs Lab 02/06/15 1813  AST 74*  ALT 27  ALKPHOS 79  BILITOT 0.9  ALBUMIN 3.4*   Cardiac Enzymes No results for input(s): TROPONINI, PROBNP in the last 168 hours. Glucose No results for input(s): GLUCAP in the last 168 hours.  Imaging No results found.   ASSESSMENT / PLAN:  PULMONARY A: R/o uncompensated acidosis  P:   Supplemental O2 to keep sats > 92% Follow CXR intermittently abg assessment  CARDIOVASCULAR CVL LIJ 3/1 >> A:  Septic shock  P:  MAP goal >/ 65 mm/Hg Aggressive IVF resuscitation CVP monitoring, goal 10 Start Levophed for MAP goal Wean off neo Trend troponin Repeat lactic Cortisol then stress roids Avoid vaso until we r/o bowel ischemia  RENAL A:   AKI, suspect prerenal / atn etiology in setting shock (improving) Hypokalemia  P:   IVF resuscitation Follow BMet q 12 Replete K CT to assess hydro  GASTROINTESTINAL A:   Suspect intra-abdominal infection/perforation  P:   CT Abd/pelvis STAT See ID section below Amylase Lipase LFT Consider surgical consult, as non contrast CT may be suboptimals study, abdo examination concerning Unable to use oral contrast with constant vomiting Place NGT to int suction  HEMATOLOGIC A:   DVT prevention  P:  Follow Bmet Sub q hep Assess diff  INFECTIOUS A:   Septic shock suspect intraabdominal etiology UTI minimal (not the issue)  P:   BCx2 3/1 > UC 3/1 > Cdif > Viral panel 3/1 > Abx: Aztreonam, start date 3/1 > Abx: Vancomycin, start date 3/1 > Abx: Flagyl, start date 3/1 > Abx: Levaquin, start date 3/1 > Diflucan, start date  3/1 > Trend PCT, concerning with peak 200 Monitor WBC and fever curve Gen surgery consult STAT CT done Consider US correlate   ENDOCRINE A:   No acute issues    P:   Check cortisol then stress roids Follow glucose on chemistry  NEUROLOGIC A:   No acute issues  P:   RASS goal: 0 Monitor   FAMILY  - Updates:   - Inter-disciplinary family meet or Palliative Care meeting due by:  3/8  Joneen RoachPaul Hoffman, AGACNP-BC [facility] Pager [phone] or [phone]    STAFF NOTE: Cindi CarbonI, Shaaron Golliday, MD FACP have personally reviewed patient's available data, including medical history, events of note, physical examination and test results as part of my evaluation. I have discussed with resident/NP and other care providers such as pharmacist, RN and RRT. In addition, I personally evaluated patient and elicited key findings of: toxic appearance, abdominal tender diffuse, guarding, surgery to see, US abdo correlation, volume, aggressive abdo coverage, add diflucan / flagyl, line, sepsis protocol, cvp goal 10, cortisol, assess lipase, amy, lactic further, lft The patient is critically ill  with multiple organ systems failure and requires high complexity decision making for assessment and support, frequent evaluation and titration of therapies, application of advanced monitoring technologies and extensive interpretation of multiple databases.   Critical Care Time devoted to patient care services described in this note is 90 Minutes. This time reflects time of care of this signee: Rory Percy, MD FACP. This critical care time does not reflect procedure time, or teaching time or supervisory time of PA/NP/Med student/Med Resident etc but could involve care discussion time. Rest per NP/medical resident whose note is outlined above and that I agree with   Mcarthur Rossetti. Tyson Alias, MD, FACP Pgr: [phone] [facility] 02/06/2015 11:29 PM

## 2015-02-06 NOTE — Progress Notes (Signed)
ANTIBIOTIC CONSULT NOTE - INITIAL  Pharmacy Consult for Fluconazole/Flagyl Indication: rule out sepsis, ?intra-abdominal infection   Allergies  Allergen Reactions  . Penicillins Other (See Comments)    childhood    Patient Measurements: Height: 5\' 7"  (170.2 cm) Weight: 270 lb (122.471 kg) IBW/kg (Calculated) : 61.6  Vital Signs: Temp: 103.7 F (39.8 C) (03/01 1955) Temp Source: Rectal (03/01 1955) BP: 76/43 mmHg (03/01 2205) Pulse Rate: 130 (03/01 2205)  Labs:  Recent Labs  02/06/15 1813 02/06/15 2146  WBC 20.9*  --   HGB 13.4  --   PLT 191  --   CREATININE 4.23* 3.84*   Estimated Creatinine Clearance: 27.8 mL/min (by C-G formula based on Cr of 3.84).  Assessment: Adding fluconazole/flagyl for possible intra-abdominal infection. Renal function requiring fluconazole dose adjustment.   Plan:  -Fluconazole 200 mg IV q24h -Flagyl 500 mg IV q8h -F/U sepsis work-up  Lisa Lindsey, Lisa Lindsey 02/06/2015,10:49 PM

## 2015-02-06 NOTE — ED Notes (Signed)
MD said he will wait on any antibiotics.

## 2015-02-06 NOTE — ED Notes (Signed)
Spoke with EDP regarding code sepsis stated will wait for i-stat lactic acid.

## 2015-02-06 NOTE — ED Notes (Signed)
Phlebotomy at bedside collecting cultures 

## 2015-02-06 NOTE — ED Notes (Signed)
Called level 2 sepsis

## 2015-02-06 NOTE — ED Notes (Signed)
Patient states she has not been able to keep any food or fluids down since the onset at 02/04/15.

## 2015-02-06 NOTE — ED Notes (Signed)
MD at bedside. 

## 2015-02-06 NOTE — ED Notes (Signed)
Phlebotomy at bedside. ED Doctor at bedside.

## 2015-02-06 NOTE — ED Notes (Addendum)
Pt c/o nausea, vomiting and diarrhea onset yesterday.  Pt st's she feels weak.  Also st's she had a syncopal episode today.

## 2015-02-06 NOTE — ED Notes (Signed)
EDP states patient is volume depleted.

## 2015-02-06 NOTE — ED Notes (Signed)
Lactic acid result shown to Dr. Steinl 

## 2015-02-06 NOTE — ED Notes (Signed)
3 phlebotomist attempted to obtain blood cultures unable at this time will attempt again.

## 2015-02-06 NOTE — ED Notes (Signed)
NOTIFIED DR. Denton LankSTEINL FOR PATIENTS LAB RESULTS OF CG4+LACTIC ACID @18 :43 PM ,

## 2015-02-06 NOTE — Procedures (Signed)
Central Venous Catheter Insertion Procedure Note Lisa Lindsey [id number] 09/03/1981  Procedure: Insertion of Central Venous Catheter Indications: Assessment of intravascular volume, Drug and/or fluid administration and Frequent blood sampling  Procedure Details Consent: Risks of procedure as well as the alternatives and risks of each were explained to the (patient/caregiver).  Consent for procedure obtained. Time Out: Verified patient identification, verified procedure, site/side was marked, verified correct patient position, special equipment/implants available, medications/allergies/relevent history reviewed, required imaging and test results available.  Performed  Maximum sterile technique was used including antiseptics, cap, gloves, gown, hand hygiene, mask and sheet. Skin prep: Chlorhexidine; local anesthetic administered A antimicrobial bonded/coated triple lumen catheter was placed in the left internal jugular vein using the Seldinger technique. Ultrasound guidance used.Yes.   Catheter placed to 19 cm. Blood aspirated via all 3 ports and then flushed x 3. Line sutured x 2 and dressing applied.  Evaluation Blood flow good Complications: No apparent complications Patient did tolerate procedure well. Chest X-ray ordered to verify placement.  CXR: normal.   I performed this procedure US Mcarthur Rossettianiel J. Tyson AliasFeinstein, MD, FACP Pgr: [phone] [facility]

## 2015-02-07 ENCOUNTER — Inpatient Hospital Stay (HOSPITAL_COMMUNITY): Payer: Medicaid Other

## 2015-02-07 DIAGNOSIS — E860 Dehydration: Secondary | ICD-10-CM | POA: Diagnosis present

## 2015-02-07 DIAGNOSIS — R109 Unspecified abdominal pain: Secondary | ICD-10-CM | POA: Insufficient documentation

## 2015-02-07 DIAGNOSIS — Z452 Encounter for adjustment and management of vascular access device: Secondary | ICD-10-CM | POA: Insufficient documentation

## 2015-02-07 DIAGNOSIS — R197 Diarrhea, unspecified: Secondary | ICD-10-CM

## 2015-02-07 DIAGNOSIS — A419 Sepsis, unspecified organism: Secondary | ICD-10-CM | POA: Insufficient documentation

## 2015-02-07 DIAGNOSIS — R112 Nausea with vomiting, unspecified: Secondary | ICD-10-CM | POA: Diagnosis present

## 2015-02-07 DIAGNOSIS — R509 Fever, unspecified: Secondary | ICD-10-CM | POA: Insufficient documentation

## 2015-02-07 DIAGNOSIS — N179 Acute kidney failure, unspecified: Secondary | ICD-10-CM | POA: Diagnosis present

## 2015-02-07 DIAGNOSIS — IMO0001 Reserved for inherently not codable concepts without codable children: Secondary | ICD-10-CM | POA: Insufficient documentation

## 2015-02-07 LAB — BLOOD GAS, ARTERIAL
ACID-BASE DEFICIT: 9.5 mmol/L — AB (ref 0.0–2.0)
Bicarbonate: 14.8 mEq/L — ABNORMAL LOW (ref 20.0–24.0)
DRAWN BY: 42624
O2 Content: 6 L/min
O2 Saturation: 99.4 %
PATIENT TEMPERATURE: 98.6
PH ART: 7.366 (ref 7.350–7.450)
TCO2: 15.6 mmol/L (ref 0–100)
pCO2 arterial: 26.5 mmHg — ABNORMAL LOW (ref 35.0–45.0)
pO2, Arterial: 204 mmHg — ABNORMAL HIGH (ref 80.0–100.0)

## 2015-02-07 LAB — CORTISOL: CORTISOL PLASMA: 77.3 ug/dL

## 2015-02-07 LAB — TROPONIN I
TROPONIN I: 0.04 ng/mL — AB (ref <0.031)
Troponin I: 0.08 ng/mL — ABNORMAL HIGH (ref <0.031)
Troponin I: 0.06 ng/mL — ABNORMAL HIGH (ref <0.031)

## 2015-02-07 LAB — URINE MICROSCOPIC-ADD ON

## 2015-02-07 LAB — BASIC METABOLIC PANEL
ANION GAP: 12 (ref 5–15)
ANION GAP: 10 (ref 5–15)
ANION GAP: 10 (ref 5–15)
ANION GAP: 7 (ref 5–15)
BUN: 26 mg/dL — ABNORMAL HIGH (ref 6–23)
BUN: 24 mg/dL — ABNORMAL HIGH (ref 6–23)
BUN: 23 mg/dL (ref 6–23)
BUN: 24 mg/dL — ABNORMAL HIGH (ref 6–23)
CALCIUM: 7.1 mg/dL — AB (ref 8.4–10.5)
CALCIUM: 7.2 mg/dL — AB (ref 8.4–10.5)
CHLORIDE: 110 mmol/L (ref 96–112)
CHLORIDE: 115 mmol/L — AB (ref 96–112)
CO2: 15 mmol/L — ABNORMAL LOW (ref 19–32)
CO2: 17 mmol/L — ABNORMAL LOW (ref 19–32)
CO2: 20 mmol/L (ref 19–32)
CO2: 22 mmol/L (ref 19–32)
CREATININE: 2 mg/dL — AB (ref 0.50–1.10)
Calcium: 6.8 mg/dL — ABNORMAL LOW (ref 8.4–10.5)
Calcium: 7 mg/dL — ABNORMAL LOW (ref 8.4–10.5)
Chloride: 109 mmol/L (ref 96–112)
Chloride: 116 mmol/L — ABNORMAL HIGH (ref 96–112)
Creatinine, Ser: 3.24 mg/dL — ABNORMAL HIGH (ref 0.50–1.10)
Creatinine, Ser: 2.81 mg/dL — ABNORMAL HIGH (ref 0.50–1.10)
Creatinine, Ser: 1.7 mg/dL — ABNORMAL HIGH (ref 0.50–1.10)
GFR calc Af Amer: 36 mL/min — ABNORMAL LOW (ref >90)
GFR calc non Af Amer: 17 mL/min — ABNORMAL LOW (ref >90)
GFR calc non Af Amer: 21 mL/min — ABNORMAL LOW (ref >90)
GFR calc non Af Amer: 31 mL/min — ABNORMAL LOW (ref >90)
GFR calc non Af Amer: 38 mL/min — ABNORMAL LOW (ref >90)
GFR, EST AFRICAN AMERICAN: 20 mL/min — AB (ref >90)
GFR, EST AFRICAN AMERICAN: 24 mL/min — AB (ref >90)
GFR, EST AFRICAN AMERICAN: 44 mL/min — AB (ref >90)
GLUCOSE: 164 mg/dL — AB (ref 70–99)
GLUCOSE: 157 mg/dL — AB (ref 70–99)
Glucose, Bld: 93 mg/dL (ref 70–99)
Glucose, Bld: 160 mg/dL — ABNORMAL HIGH (ref 70–99)
POTASSIUM: 3.6 mmol/L (ref 3.5–5.1)
POTASSIUM: 3.3 mmol/L — AB (ref 3.5–5.1)
Potassium: 3.3 mmol/L — ABNORMAL LOW (ref 3.5–5.1)
Potassium: 4 mmol/L (ref 3.5–5.1)
Sodium: 136 mmol/L (ref 135–145)
Sodium: 137 mmol/L (ref 135–145)
Sodium: 145 mmol/L (ref 135–145)
Sodium: 145 mmol/L (ref 135–145)

## 2015-02-07 LAB — HEPATIC FUNCTION PANEL
ALBUMIN: 2.4 g/dL — AB (ref 3.5–5.2)
ALT: 22 U/L (ref 0–35)
AST: 69 U/L — ABNORMAL HIGH (ref 0–37)
Alkaline Phosphatase: 66 U/L (ref 39–117)
BILIRUBIN INDIRECT: 0.5 mg/dL (ref 0.3–0.9)
Bilirubin, Direct: 0.3 mg/dL (ref 0.0–0.5)
TOTAL PROTEIN: 5.2 g/dL — AB (ref 6.0–8.3)
Total Bilirubin: 0.8 mg/dL (ref 0.3–1.2)

## 2015-02-07 LAB — CBC
HCT: 33.2 % — ABNORMAL LOW (ref 36.0–46.0)
Hemoglobin: 10.8 g/dL — ABNORMAL LOW (ref 12.0–15.0)
MCH: 27.1 pg (ref 26.0–34.0)
MCHC: 32.5 g/dL (ref 30.0–36.0)
MCV: 83.4 fL (ref 78.0–100.0)
PLATELETS: 121 10*3/uL — AB (ref 150–400)
RBC: 3.98 MIL/uL (ref 3.87–5.11)
RDW: 16 % — AB (ref 11.5–15.5)
WBC: 26.1 10*3/uL — AB (ref 4.0–10.5)

## 2015-02-07 LAB — CBC WITH DIFFERENTIAL/PLATELET
Basophils Absolute: 0 10*3/uL (ref 0.0–0.1)
Basophils Relative: 0 % (ref 0–1)
Eosinophils Absolute: 0 10*3/uL (ref 0.0–0.7)
Eosinophils Relative: 0 % (ref 0–5)
HEMATOCRIT: 30.2 % — AB (ref 36.0–46.0)
HEMOGLOBIN: 10 g/dL — AB (ref 12.0–15.0)
LYMPHS ABS: 1.4 10*3/uL (ref 0.7–4.0)
LYMPHS PCT: 8 % — AB (ref 12–46)
MCH: 27.5 pg (ref 26.0–34.0)
MCHC: 33.1 g/dL (ref 30.0–36.0)
MCV: 83 fL (ref 78.0–100.0)
Monocytes Absolute: 1 10*3/uL (ref 0.1–1.0)
Monocytes Relative: 5 % (ref 3–12)
Neutro Abs: 16.7 10*3/uL — ABNORMAL HIGH (ref 1.7–7.7)
Neutrophils Relative %: 87 % — ABNORMAL HIGH (ref 43–77)
PLATELETS: UNDETERMINED 10*3/uL (ref 150–400)
RBC: 3.64 MIL/uL — ABNORMAL LOW (ref 3.87–5.11)
RDW: 16.1 % — ABNORMAL HIGH (ref 11.5–15.5)
WBC: 19.1 10*3/uL — ABNORMAL HIGH (ref 4.0–10.5)

## 2015-02-07 LAB — URINALYSIS, ROUTINE W REFLEX MICROSCOPIC
Bilirubin Urine: NEGATIVE
Glucose, UA: NEGATIVE mg/dL
Ketones, ur: NEGATIVE mg/dL
Leukocytes, UA: NEGATIVE
Nitrite: NEGATIVE
Protein, ur: NEGATIVE mg/dL
SPECIFIC GRAVITY, URINE: 1.007 (ref 1.005–1.030)
Urobilinogen, UA: 0.2 mg/dL (ref 0.0–1.0)
pH: 5.5 (ref 5.0–8.0)

## 2015-02-07 LAB — CARBOXYHEMOGLOBIN
Carboxyhemoglobin: 1.2 % (ref 0.5–1.5)
METHEMOGLOBIN: 0.8 % (ref 0.0–1.5)
O2 Saturation: 84.1 %
Total hemoglobin: 10.8 g/dL — ABNORMAL LOW (ref 12.0–16.0)

## 2015-02-07 LAB — URINE CULTURE
Colony Count: NO GROWTH
Culture: NO GROWTH
Special Requests: NORMAL

## 2015-02-07 LAB — LACTIC ACID, PLASMA: Lactic Acid, Venous: 1.9 mmol/L (ref 0.5–2.0)

## 2015-02-07 LAB — LACTATE DEHYDROGENASE: LDH: 502 U/L — ABNORMAL HIGH (ref 94–250)

## 2015-02-07 LAB — TSH: TSH: 4.971 u[IU]/mL — AB (ref 0.350–4.500)

## 2015-02-07 LAB — GLUCOSE, CAPILLARY
GLUCOSE-CAPILLARY: 158 mg/dL — AB (ref 70–99)
Glucose-Capillary: 86 mg/dL (ref 70–99)

## 2015-02-07 LAB — AMYLASE: Amylase: 49 U/L (ref 0–105)

## 2015-02-07 LAB — PROCALCITONIN: Procalcitonin: 84.8 ng/mL

## 2015-02-07 LAB — MRSA PCR SCREENING: MRSA by PCR: POSITIVE — AB

## 2015-02-07 MED ORDER — VANCOMYCIN HCL 10 G IV SOLR
1500.0000 mg | INTRAVENOUS | Status: DC
  Administered 2015-02-07: 1500 mg via INTRAVENOUS
  Filled 2015-02-07 (×2): qty 1500

## 2015-02-07 MED ORDER — ONDANSETRON HCL 4 MG/2ML IJ SOLN
4.0000 mg | Freq: Four times a day (QID) | INTRAMUSCULAR | Status: DC | PRN
  Administered 2015-02-07 – 2015-02-10 (×7): 4 mg via INTRAVENOUS
  Filled 2015-02-07 (×8): qty 2

## 2015-02-07 MED ORDER — SODIUM BICARBONATE 8.4 % IV SOLN
INTRAVENOUS | Status: DC
  Administered 2015-02-07: 10:00:00 via INTRAVENOUS
  Filled 2015-02-07 (×4): qty 150

## 2015-02-07 MED ORDER — CETYLPYRIDINIUM CHLORIDE 0.05 % MT LIQD
7.0000 mL | Freq: Two times a day (BID) | OROMUCOSAL | Status: DC
  Administered 2015-02-07 – 2015-02-08 (×4): 7 mL via OROMUCOSAL

## 2015-02-07 MED ORDER — SODIUM CHLORIDE 0.45 % IV SOLN
INTRAVENOUS | Status: DC
  Administered 2015-02-07: 10:00:00 via INTRAVENOUS

## 2015-02-07 MED ORDER — SODIUM CHLORIDE 0.9 % IV BOLUS (SEPSIS)
500.0000 mL | Freq: Once | INTRAVENOUS | Status: AC
  Administered 2015-02-07: 500 mL via INTRAVENOUS

## 2015-02-07 MED ORDER — POTASSIUM CHLORIDE 10 MEQ/50ML IV SOLN
10.0000 meq | INTRAVENOUS | Status: AC
  Administered 2015-02-07 (×3): 10 meq via INTRAVENOUS
  Filled 2015-02-07 (×3): qty 50

## 2015-02-07 MED ORDER — CHLORHEXIDINE GLUCONATE CLOTH 2 % EX PADS
6.0000 | MEDICATED_PAD | Freq: Every day | CUTANEOUS | Status: DC
  Administered 2015-02-07 – 2015-02-10 (×4): 6 via TOPICAL

## 2015-02-07 MED ORDER — NOREPINEPHRINE BITARTRATE 1 MG/ML IV SOLN
5.0000 ug/min | INTRAVENOUS | Status: DC
  Administered 2015-02-07: 40 ug/min via INTRAVENOUS
  Filled 2015-02-07 (×2): qty 16

## 2015-02-07 MED ORDER — LEVOFLOXACIN IN D5W 750 MG/150ML IV SOLN
750.0000 mg | INTRAVENOUS | Status: DC
  Filled 2015-02-07: qty 150

## 2015-02-07 MED ORDER — FENTANYL CITRATE 0.05 MG/ML IJ SOLN
12.5000 ug | INTRAMUSCULAR | Status: DC | PRN
  Administered 2015-02-07 (×3): 50 ug via INTRAVENOUS
  Administered 2015-02-07: 12.5 ug via INTRAVENOUS
  Administered 2015-02-07 (×2): 50 ug via INTRAVENOUS
  Administered 2015-02-07: 25 ug via INTRAVENOUS
  Administered 2015-02-08: 50 ug via INTRAVENOUS
  Filled 2015-02-07 (×9): qty 2

## 2015-02-07 MED ORDER — MUPIROCIN 2 % EX OINT
1.0000 "application " | TOPICAL_OINTMENT | Freq: Two times a day (BID) | CUTANEOUS | Status: DC
  Administered 2015-02-07 – 2015-02-10 (×8): 1 via NASAL
  Filled 2015-02-07 (×2): qty 22

## 2015-02-07 MED ORDER — CHLORHEXIDINE GLUCONATE 0.12 % MT SOLN
15.0000 mL | Freq: Two times a day (BID) | OROMUCOSAL | Status: DC
  Administered 2015-02-07 – 2015-02-08 (×4): 15 mL via OROMUCOSAL
  Filled 2015-02-07 (×5): qty 15

## 2015-02-07 NOTE — Progress Notes (Signed)
CRITICAL VALUE ALERT  Critical value received:  + MRSA PCR  Date of notification:  02/07/2015  Time of notification:  2:17 AM  Critical value read back:Yes.    Nurse who received alert:  Terrilyn SaverHopper, Nekesha Font Anderson  MD notified (1st page):  Joneen RoachPaul Hoffman, NP  Time of first page:  2:17 AM  MD notified (2nd page):  Time of second page:  Responding MD:  Joneen RoachPaul Hoffman, NP  Time MD responded:  2:17 AM

## 2015-02-07 NOTE — H&P (Addendum)
PULMONARY / CRITICAL CARE MEDICINE   Name: Aaliya Maultsby MRN: [id number] DOB: 02/07/1981    ADMISSION DATE:  02/06/2015 CONSULTATION DATE:  02/06/2015  REFERRING MD :  EDP  CHIEF COMPLAINT:  Abd pain  INITIAL PRESENTATION: 34 year old female with several day history of abdominal pain/nausea/vomiting. In ED she was profoundly hypotensive refractory to aggressive IVF resuscitation. PCCM to admit   STUDIES:  CT abd >>>Vague nonspecific bilateral perinephric stranding noted. This may be within normal limits, though pyelonephritis cannot be excluded, big spleen  SIGNIFICANT EVENTS: 3/1- abdo pain, septic shock, surgical consult 3/2- [place] abdo>>>Borderline prominence of the common bile duct  SUBJECTIVE: remains on 20 levophed  VITAL SIGNS: Temp:  [98.7 F (37.1 C)-103.7 F (39.8 C)] 101.3 F (38.5 C) (03/02 0357) Pulse Rate:  [34-143] 101 (03/02 0800) Resp:  [18-41] 22 (03/02 0800) BP: (52-244)/(23-227) 116/34 mmHg (03/02 0800) SpO2:  [94 %-100 %] 98 % (03/02 0800) Weight:  [122.471 kg (270 lb)-260.818 kg (575 lb)] 140.161 kg (309 lb) (03/02 0500) HEMODYNAMICS: CVP:  [5 mmHg] 5 mmHg VENTILATOR SETTINGS:   INTAKE / OUTPUT:  Intake/Output Summary (Last 24 hours) at 02/07/15 0824 Last data filed at 02/07/15 0800  Gross per 24 hour  Intake 2094.71 ml  Output   1000 ml  Net 1094.71 ml    PHYSICAL EXAMINATION: General:  Obese female in no resp distress Neuro:  Alert, oriented, non focal HEENT: obese neck, jvd increased Cardiovascular:  RRR, no MRG Lungs:  Slight coarse Abdomen:  Soft, less tender, no rebound, no guard today, BS low Musculoskeletal:  No acute deformity Skin:  Grossly intact  LABS:  CBC  Recent Labs Lab 02/06/15 1813 02/07/15 0219 02/07/15 0500  WBC 20.9* 19.1* 26.1*  HGB 13.4 10.0* 10.8*  HCT 40.4 30.2* 33.2*  PLT 191 PLATELET CLUMPS NOTED ON SMEAR, UNABLE TO ESTIMATE 121*   Coag's No results for input(s): APTT, INR in the last 168  hours. BMET  Recent Labs Lab 02/06/15 2146 02/07/15 0219 02/07/15 0544  NA 136 136 137  K 3.3* 3.6 3.3*  CL 106 109 110  CO2 18* 15* 17*  BUN 24* 26* 24*  CREATININE 3.84* 3.24* 2.81*  GLUCOSE 95 93 160*   Electrolytes  Recent Labs Lab 02/06/15 2146 02/07/15 0219 02/07/15 0544  CALCIUM 7.0* 6.8* 7.0*   Sepsis Markers  Recent Labs Lab 02/06/15 1813 02/06/15 1840 02/06/15 2106 02/07/15 0200 02/07/15 0500  LATICACIDVEN  --  4.28* 3.05* 1.9  --   PROCALCITON >200.00  --   --   --  84.80   ABG  Recent Labs Lab 02/06/15 2338  PHART 7.366  PCO2ART 26.5*  PO2ART 204.0*   Liver Enzymes  Recent Labs Lab 02/06/15 1813 02/07/15 0220  AST 74* 69*  ALT 27 22  ALKPHOS 79 66  BILITOT 0.9 0.8  ALBUMIN 3.4* 2.4*   Cardiac Enzymes  Recent Labs Lab 02/07/15 0218 02/07/15 0500  TROPONINI 0.08* 0.06*   Glucose  Recent Labs Lab 02/07/15 0037  GLUCAP 86    Imaging Ct Abdomen Pelvis Wo Contrast  02/06/2015   CLINICAL DATA:  Acute onset of nausea and vomiting. Upper abdominal pain. Fever. Initial encounter.  EXAM: CT ABDOMEN AND PELVIS WITHOUT CONTRAST  TECHNIQUE: Multidetector CT imaging of the abdomen and pelvis was performed following the standard protocol without IV contrast.  COMPARISON:  None.  FINDINGS: The visualized lung bases are clear.  The liver is unremarkable in appearance. The spleen is enlarged, measuring 15.4 cm in  length. The gallbladder is within normal limits. The pancreas and adrenal glands are unremarkable.  Vague nonspecific perinephric stranding is noted bilaterally. This may be within normal limits, though pyelonephritis cannot be excluded, given the patient's fever and renal failure. There is no evidence of hydronephrosis. No renal or ureteral stones are seen.  No free fluid is identified. The small bowel is unremarkable in appearance. The stomach is within normal limits. No acute vascular abnormalities are seen.  The appendix is normal in  caliber, without evidence for appendicitis. There is diffuse fatty infiltration involving much of the wall of the colon, with sparing of the sigmoid colon, likely reflecting chronic sequelae of inflammation. The colon is otherwise unremarkable.  The bladder is mildly distended and grossly unremarkable. The uterus is within normal limits. The patient's intrauterine device is noted in expected position at the uterine fundus. No suspicious adnexal masses are seen. The ovaries are relatively symmetric. No inguinal lymphadenopathy is seen.  No acute osseous abnormalities are identified.  IMPRESSION: 1. Vague nonspecific bilateral perinephric stranding noted. This may be within normal limits, though pyelonephritis cannot be excluded, given the patient's fever and renal failure. 2. Splenomegaly noted. 3. Diffuse fatty infiltration involving much of the wall of the colon, with sparing of the sigmoid colon, likely reflecting chronic sequelae of prior inflammation.   Electronically Signed   By: Roanna RaiderJeffery  Chang M.D.   On: 02/06/2015 23:50   Dg Chest Port 1 View  02/07/2015   CLINICAL DATA:  Initial evaluation for fever, nausea, vomiting.  EXAM: PORTABLE CHEST - 1 VIEW  COMPARISON:  Prior radiograph from earlier the same day.  FINDINGS: There has been interval placement of a left IJ approach central venous catheter with tip overlying the distal left brachiocephalic vein. Transverse heart size at the upper limits of normal. Mediastinal silhouette within normal limits.  Lungs are normally inflated. No focal infiltrate, pulmonary edema, or pleural effusion. No pneumothorax.  No acute osseus abnormality. No free air underneath the hemidiaphragms on this semi upright film.  IMPRESSION: 1. Tip of left IJ approach central venous catheter overlying the distal left brachiocephalic vein. 2. No other active cardiopulmonary disease.   Electronically Signed   By: Rise MuBenjamin  McClintock M.D.   On: 02/07/2015 00:07   Dg Chest Port 1  View  02/06/2015   CLINICAL DATA:  Acute onset of generalized abdominal pain and vomiting. Dehydration. Initial encounter.  EXAM: PORTABLE CHEST - 1 VIEW  COMPARISON:  None.  FINDINGS: The lungs are hypoexpanded. Mild left basilar atelectasis is noted. There is no evidence of pleural effusion or pneumothorax.  The cardiomediastinal silhouette is borderline normal in size. No acute osseous abnormalities are seen.  IMPRESSION: Lungs hypoexpanded, with mild left basilar atelectasis.   Electronically Signed   By: Roanna RaiderJeffery  Chang M.D.   On: 02/06/2015 19:57     ASSESSMENT / PLAN:  PULMONARY A: At risk pulm edema  P:   Supplemental O2 to keep sats > 92% Follow CXR in am for edema abg assessment - wnl  CARDIOVASCULAR CVL LIJ 3/1 >> A:  Septic shock P:  cvp 5, remains on 20 levophed Goal MAP 65, add sys goal 90, consider bolus Await cortisol Keep stress roids No vaso as some prior concern ischemia  RENAL A:   AKI, suspect prerenal / atn etiology in setting shock (improving) NONAG from saline P:   bmet q8h Consider addition bicarb for NONAG Avoid saline as able Continued pos balance k supp  GASTROINTESTINAL A:  Suspect intra-abdominal infection  P:   NGT to int suction Will wait for surgery to approve any diet Concern is bacterial translocation, see ID [place] reassuring ppi Low suspicion cdiff  HEMATOLOGIC A:   DVT prevention leukocytosis P:  Follow Bmet Sub q hep  INFECTIOUS A:   Septic shock suspect intraabdominal etiology Concern is bacterial translocation after viral illness UTI unimpressive R/o pyelo (UA neg)  P:   BCx2 3/1 > UC 3/1 > Cdif > Viral panel 3/1 > Abx: Aztreonam, start date 3/1 > Abx: Vancomycin, start date 3/1 > Abx: Flagyl, start date 3/1 >>3/2 Abx: Levaquin, start date 3/1 > Diflucan, start date 3/1 >3/2  Dc diflucan, flagyl Keep double cov gram neg for now Follow BC Repeat UA  ENDOCRINE A:  R/o rel AI    P:   Check cortisol  then stress roids Follow glucose on chemistry tsh repeat in 1 week  NEUROLOGIC A:   No acute issues  P:   RASS goal: 0 Monitor for pain   FAMILY  - Updates:   - Inter-disciplinary family meet or Palliative Care meeting due by:  3/8  Ccm time 30 min   Mcarthur Rossetti. Tyson Alias, MD, FACP Pgr: [phone] [facility]

## 2015-02-07 NOTE — Progress Notes (Signed)
ANTIBIOTIC CONSULT NOTE - FOLLOW UP  Pharmacy Consult for vancomycin, levaquin, aztreonam Indication: rule out sepsis  Allergies  Allergen Reactions  . Penicillins Other (See Comments)    childhood    Patient Measurements: Height:  (170.2 cm) Weight: (!) 309 lb (140.161 kg) IBW/kg (Calculated) : 61.6 Vital Signs: Temp: 99 F (37.2 C) (03/02 1214) Temp Source: Oral (03/02 1214) BP: 100/57 mmHg (03/02 1100) Pulse Rate: 98 (03/02 1100) Intake/Output from previous day: 03/01 0701 - 03/02 0700 In: 1842.2 [I.V.:1412.2; NG/GT:30; IV Piggyback:400] Out: 1000 [Urine:600; Emesis/NG output:400] Intake/Output from this shift: Total I/O In: 352.6 [I.V.:252.6; IV Piggyback:100] Out: -   Labs:  Recent Labs  02/06/15 1813 02/06/15 2146 02/07/15 0219 02/07/15 0500 02/07/15 0544  WBC 20.9*  --  19.1* 26.1*  --   HGB 13.4  --  10.0* 10.8*  --   PLT 191  --  PLATELET CLUMPS NOTED ON SMEAR, UNABLE TO ESTIMATE 121*  --   CREATININE 4.23* 3.84* 3.24*  --  2.81*   Estimated Creatinine Clearance: 41 mL/min (by C-G formula based on Cr of 2.81). No results for input(s): VANCOTROUGH, VANCOPEAK, VANCORANDOM, GENTTROUGH, GENTPEAK, GENTRANDOM, TOBRATROUGH, TOBRAPEAK, TOBRARND, AMIKACINPEAK, AMIKACINTROU, AMIKACIN in the last 72 hours.   Microbiology: Recent Results (from the past 720 hour(s))  MRSA PCR Screening     Status: Abnormal   Collection Time: 02/07/15 12:28 AM  Result Value Ref Range Status   MRSA by PCR POSITIVE (A) NEGATIVE Final    Comment:        The GeneXpert MRSA Assay (FDA approved for NASAL specimens only), is one component of a comprehensive MRSA colonization surveillance program. It is not intended to diagnose MRSA infection nor to guide or monitor treatment for MRSA infections. RESULT CALLED TO, READ BACK BY AND VERIFIED WITH: HANCOCK,M RN 1610 02/07/15 MITCHELL,L     Anti-infectives    Start     Dose/Rate Route Frequency Ordered Stop   02/08/15 2100   vancomycin (VANCOCIN) 1,750 mg in sodium chloride 0.9 % 500 mL IVPB  Status:  Discontinued     1,750 mg 250 mL/hr over 120 Minutes Intravenous Every 48 hours 02/06/15 2013 02/07/15 1216   02/08/15 2100  levofloxacin (LEVAQUIN) IVPB 500 mg     500 mg 100 mL/hr over 60 Minutes Intravenous Every 48 hours 02/06/15 2013     02/07/15 2000  vancomycin (VANCOCIN) 1,500 mg in sodium chloride 0.9 % 500 mL IVPB     1,500 mg 250 mL/hr over 120 Minutes Intravenous Every 24 hours 02/07/15 1216     02/07/15 0600  aztreonam (AZACTAM) 1 g in dextrose 5 % 50 mL IVPB     1 g 100 mL/hr over 30 Minutes Intravenous 3 times per day 02/06/15 2013     02/06/15 2330  metroNIDAZOLE (FLAGYL) IVPB 500 mg  Status:  Discontinued     500 mg 100 mL/hr over 60 Minutes Intravenous Every 8 hours 02/06/15 2241 02/07/15 0842   02/06/15 2300  fluconazole (DIFLUCAN) IVPB 200 mg  Status:  Discontinued     200 mg 100 mL/hr over 60 Minutes Intravenous Every 24 hours 02/06/15 2241 02/07/15 0836   02/06/15 2015  vancomycin (VANCOCIN) IVPB 750 mg/150 ml premix     750 mg 150 mL/hr over 60 Minutes Intravenous NOW 02/06/15 2013 02/07/15 2015   02/06/15 1945  levofloxacin (LEVAQUIN) IVPB 750 mg     750 mg 100 mL/hr over 90 Minutes Intravenous  Once 02/06/15 1944 02/07/15 0008  02/06/15 1945  aztreonam (AZACTAM) 2 g in dextrose 5 % 50 mL IVPB     2 g 100 mL/hr over 30 Minutes Intravenous  Once 02/06/15 1944 02/07/15 0008   02/06/15 1945  vancomycin (VANCOCIN) IVPB 1000 mg/200 mL premix     1,000 mg 200 mL/hr over 60 Minutes Intravenous  Once 02/06/15 1944 02/06/15 2140      Assessment: 34 year old female with sepsis and concern for abdominal infection versus pyelonephritis on IV vancomycin, levaquin, and aztreonam. Penicillin allergy noted.   WBC is up today at 26, SCr has improved some to 2.81. LA and PCT are elevated but trending down. Cultures are pending.   Goal of Therapy:  Vancomycin trough level 15-20 mcg/ml  Plan:   Increase vancomycin to 1500mg  IV every 24 hours.  Increase Levaquin to 750mg  IV every 48 hours.  Continue Aztreonam 1g IV every 8 hours.   Link SnufferJessica Worthington Cruzan, PharmD, BCPS Clinical Pharmacist [phone] 02/07/2015,12:17 PM

## 2015-02-07 NOTE — Progress Notes (Signed)
Patient ID: Lisa Lindsey, female   DOB: 12/28/1980, 34 y.o.   MRN: [id number]    Subjective: Pt sleepy, but states she feels a little better today.  Still with some abdominal discomfort.  NGT in place.  Objective: Vital signs in last 24 hours: Temp:  [98.7 F (37.1 C)-103.7 F (39.8 C)] 101.3 F (38.5 C) (03/02 0357) Pulse Rate:  [34-143] 101 (03/02 0800) Resp:  [18-41] 22 (03/02 0800) BP: (52-244)/(23-227) 116/34 mmHg (03/02 0800) SpO2:  [94 %-100 %] 98 % (03/02 0800) Weight:  [270 lb (122.471 kg)-575 lb (260.818 kg)] 309 lb (140.161 kg) (03/02 0500)    Intake/Output from previous day: 03/01 0701 - 03/02 0700 In: 1842.2 [I.V.:1412.2; NG/GT:30; IV Piggyback:400] Out: 1000 [Urine:600; Emesis/NG output:400] Intake/Output this shift: Total I/O In: 252.6 [I.V.:152.6; IV Piggyback:100] Out: -   PE: Abd: soft, some mild diffuse tenderness, + bilateral CVA tenderness, some BS, obese, ND.  NGT with only 400cc out. Heart: regular, but mild tachy Lungs: CTAB   Lab Results:   Recent Labs  02/07/15 0219 02/07/15 0500  WBC 19.1* 26.1*  HGB 10.0* 10.8*  HCT 30.2* 33.2*  PLT PLATELET CLUMPS NOTED ON SMEAR, UNABLE TO ESTIMATE 121*   BMET  Recent Labs  02/07/15 0219 02/07/15 0544  NA 136 137  K 3.6 3.3*  CL 109 110  CO2 15* 17*  GLUCOSE 93 160*  BUN 26* 24*  CREATININE 3.24* 2.81*  CALCIUM 6.8* 7.0*   PT/INR No results for input(s): LABPROT, INR in the last 72 hours. CMP     Component Value Date/Time   NA 137 02/07/2015 0544   K 3.3* 02/07/2015 0544   CL 110 02/07/2015 0544   CO2 17* 02/07/2015 0544   GLUCOSE 160* 02/07/2015 0544   BUN 24* 02/07/2015 0544   CREATININE 2.81* 02/07/2015 0544   CALCIUM 7.0* 02/07/2015 0544   PROT 5.2* 02/07/2015 0220   ALBUMIN 2.4* 02/07/2015 0220   AST 69* 02/07/2015 0220   ALT 22 02/07/2015 0220   ALKPHOS 66 02/07/2015 0220   BILITOT 0.8 02/07/2015 0220   GFRNONAA 21* 02/07/2015 0544   GFRAA 24* 02/07/2015 0544   Lipase      Component Value Date/Time   LIPASE 21 02/06/2015 2146       Studies/Results: Ct Abdomen Pelvis Wo Contrast  02/06/2015   CLINICAL DATA:  Acute onset of nausea and vomiting. Upper abdominal pain. Fever. Initial encounter.  EXAM: CT ABDOMEN AND PELVIS WITHOUT CONTRAST  TECHNIQUE: Multidetector CT imaging of the abdomen and pelvis was performed following the standard protocol without IV contrast.  COMPARISON:  None.  FINDINGS: The visualized lung bases are clear.  The liver is unremarkable in appearance. The spleen is enlarged, measuring 15.4 cm in length. The gallbladder is within normal limits. The pancreas and adrenal glands are unremarkable.  Vague nonspecific perinephric stranding is noted bilaterally. This may be within normal limits, though pyelonephritis cannot be excluded, given the patient's fever and renal failure. There is no evidence of hydronephrosis. No renal or ureteral stones are seen.  No free fluid is identified. The small bowel is unremarkable in appearance. The stomach is within normal limits. No acute vascular abnormalities are seen.  The appendix is normal in caliber, without evidence for appendicitis. There is diffuse fatty infiltration involving much of the wall of the colon, with sparing of the sigmoid colon, likely reflecting chronic sequelae of inflammation. The colon is otherwise unremarkable.  The bladder is mildly distended and grossly unremarkable. The uterus is  within normal limits. The patient's intrauterine device is noted in expected position at the uterine fundus. No suspicious adnexal masses are seen. The ovaries are relatively symmetric. No inguinal lymphadenopathy is seen.  No acute osseous abnormalities are identified.  IMPRESSION: 1. Vague nonspecific bilateral perinephric stranding noted. This may be within normal limits, though pyelonephritis cannot be excluded, given the patient's fever and renal failure. 2. Splenomegaly noted. 3. Diffuse fatty infiltration  involving much of the wall of the colon, with sparing of the sigmoid colon, likely reflecting chronic sequelae of prior inflammation.   Electronically Signed   By: Roanna RaiderJeffery  Chang M.D.   On: 02/06/2015 23:50   [place] Abdomen Complete  02/07/2015   CLINICAL DATA:  Diffuse left upper quadrant abdominal, nausea, vomiting and diarrhea. Fever. Elevated creatinine. Initial encounter.  EXAM: ULTRASOUND ABDOMEN COMPLETE  COMPARISON:  CT of the abdomen and pelvis performed 02/06/2015  FINDINGS: Gallbladder: No gallstones or wall thickening visualized. No sonographic Murphy sign noted.  Common bile duct: Diameter: 0.7 cm; this is borderline prominent, though still thought to remain within normal limits, given the lack of significant dilatation on recent CT.  Liver: No focal lesion identified. Within normal limits in parenchymal echogenicity, though somewhat coarsened in echotexture.  IVC: No abnormality visualized.  Pancreas: Visualized portion unremarkable.  Spleen:  Known splenomegaly is not well characterized on ultrasound.  Right Kidney: Length: 10.9 cm. Echogenicity within normal limits. No mass or hydronephrosis visualized. Evaluation is suboptimal due to limitations in positioning.  Left Kidney: Length: 13.5 cm. Echogenicity within normal limits. No mass or hydronephrosis visualized. Evaluation is suboptimal due to limitations in positioning.  Abdominal aorta: No aneurysm visualized. Not well characterized distally due to overlying bowel gas.  Other findings: None.  IMPRESSION: 1. Gallbladder unremarkable in appearance. 2. Borderline prominence of the common bile duct, measuring up to 0.7 cm. This thought to still remain within normal limits, given the normal appearance on recent CT. 3. Known splenomegaly is not well characterized on ultrasound. 4. Kidneys difficult to fully assess due to limitations in positioning.   Electronically Signed   By: Roanna RaiderJeffery  Chang M.D.   On: 02/07/2015 02:26   Dg Chest Port 1  View  02/07/2015   CLINICAL DATA:  Initial evaluation for fever, nausea, vomiting.  EXAM: PORTABLE CHEST - 1 VIEW  COMPARISON:  Prior radiograph from earlier the same day.  FINDINGS: There has been interval placement of a left IJ approach central venous catheter with tip overlying the distal left brachiocephalic vein. Transverse heart size at the upper limits of normal. Mediastinal silhouette within normal limits.  Lungs are normally inflated. No focal infiltrate, pulmonary edema, or pleural effusion. No pneumothorax.  No acute osseus abnormality. No free air underneath the hemidiaphragms on this semi upright film.  IMPRESSION: 1. Tip of left IJ approach central venous catheter overlying the distal left brachiocephalic vein. 2. No other active cardiopulmonary disease.   Electronically Signed   By: Rise MuBenjamin  McClintock M.D.   On: 02/07/2015 00:07   Dg Chest Port 1 View  02/06/2015   CLINICAL DATA:  Acute onset of generalized abdominal pain and vomiting. Dehydration. Initial encounter.  EXAM: PORTABLE CHEST - 1 VIEW  COMPARISON:  None.  FINDINGS: The lungs are hypoexpanded. Mild left basilar atelectasis is noted. There is no evidence of pleural effusion or pneumothorax.  The cardiomediastinal silhouette is borderline normal in size. No acute osseous abnormalities are seen.  IMPRESSION: Lungs hypoexpanded, with mild left basilar atelectasis.   Electronically Signed  By: Roanna Raider M.D.   On: 02/06/2015 19:57    Anti-infectives: Anti-infectives    Start     Dose/Rate Route Frequency Ordered Stop   02/08/15 2100  vancomycin (VANCOCIN) 1,750 mg in sodium chloride 0.9 % 500 mL IVPB     1,750 mg 250 mL/hr over 120 Minutes Intravenous Every 48 hours 02/06/15 2013     02/08/15 2100  levofloxacin (LEVAQUIN) IVPB 500 mg     500 mg 100 mL/hr over 60 Minutes Intravenous Every 48 hours 02/06/15 2013     02/07/15 0600  aztreonam (AZACTAM) 1 g in dextrose 5 % 50 mL IVPB     1 g 100 mL/hr over 30 Minutes  Intravenous 3 times per day 02/06/15 2013     02/06/15 2330  metroNIDAZOLE (FLAGYL) IVPB 500 mg  Status:  Discontinued     500 mg 100 mL/hr over 60 Minutes Intravenous Every 8 hours 02/06/15 2241 02/07/15 0842   02/06/15 2300  fluconazole (DIFLUCAN) IVPB 200 mg  Status:  Discontinued     200 mg 100 mL/hr over 60 Minutes Intravenous Every 24 hours 02/06/15 2241 02/07/15 0836   02/06/15 2015  vancomycin (VANCOCIN) IVPB 750 mg/150 ml premix     750 mg 150 mL/hr over 60 Minutes Intravenous NOW 02/06/15 2013 02/07/15 2015   02/06/15 1945  levofloxacin (LEVAQUIN) IVPB 750 mg     750 mg 100 mL/hr over 90 Minutes Intravenous  Once 02/06/15 1944 02/07/15 0008   02/06/15 1945  aztreonam (AZACTAM) 2 g in dextrose 5 % 50 mL IVPB     2 g 100 mL/hr over 30 Minutes Intravenous  Once 02/06/15 1944 02/07/15 0008   02/06/15 1945  vancomycin (VANCOCIN) IVPB 1000 mg/200 mL premix     1,000 mg 200 mL/hr over 60 Minutes Intravenous  Once 02/06/15 1944 02/06/15 2140       Assessment/Plan  1. Abdominal pain, sepsis -no evidence on CT or [place] of intra-abdominal infection.  She does have some perinephric stranding on her CT scan bilaterally.  Her UA does have infection present.  She possibly has pyelonephritis. -generally patients do not get fevers of 103.7 from their gallbladder unless they have cholangitis, which she does not have.  There is no other evidence of bowel infection, perforation, etc on her CT scan -no acute surgical indications.  Her NGT may be removed as able and her diet advanced as able.     LOS: 1 day    Donica Derouin E 02/07/2015, 9:11 AM Pager: [phone]

## 2015-02-07 NOTE — Consult Note (Signed)
Reason for Consult:  Diffuse abdominal pain/ sepsis Referring Physician: [name] is an 34 y.o. female.  HPI: This is a 34 yo female with no significant past medical or surgical history who presents with nausea, vomiting, and diarrhea over the last couple of days.  She denies any gross blood.  Initially, she complained of intermittent abdominal cramping, but during her stay in the emergency department, this pain worsened.  She has been hypotensive with a syncopal episode prior to her arrival in the ED.  She has children that have had GI illness recently.  The pain has improved slightly since hydration was initiated.  She remains tachycardic and requires pressors to maintain her BP.    No past medical history on file.  No past surgical history on file.  No family history on file.  Social History:  has no tobacco, alcohol, and drug history on file.  Allergies:  Allergies  Allergen Reactions  . Penicillins Other (See Comments)    childhood    Medications:  Prior to Admission medications   Medication Sig Start Date End Date Taking? Authorizing Lindsey  levonorgestrel (MIRENA) 20 MCG/24HR IUD 1 each by Intrauterine route once. June 2015   Yes Lisa Provider, MD  azithromycin (ZITHROMAX) 250 MG tablet Take 1 tablet (250 mg total) by mouth daily. Take first 2 tablets together, then 1 every day until finished. Patient not taking: Reported on 02/06/2015 08/29/14   Lisa Soho Muthersbaugh, PA-C  methocarbamol (ROBAXIN) 500 MG tablet Take 1 tablet (500 mg total) by mouth 2 (two) times daily. Patient not taking: Reported on 02/06/2015 08/29/14   Lisa Butts, PA-C     Results for orders placed or performed during the hospital encounter of 02/06/15 (from the past 48 hour(s))  CBC     Status: Abnormal   Collection Time: 02/06/15  6:13 PM  Result Value Ref Range   WBC 20.9 (H) 4.0 - 10.5 K/uL   RBC 4.84 3.87 - 5.11 MIL/uL   Hemoglobin 13.4 12.0 - 15.0 g/dL   HCT 40.4 36.0  - 46.0 %   MCV 83.5 78.0 - 100.0 fL   MCH 27.7 26.0 - 34.0 pg   MCHC 33.2 30.0 - 36.0 g/dL   RDW 15.9 (H) 11.5 - 15.5 %   Platelets 191 150 - 400 K/uL  Comprehensive metabolic panel     Status: Abnormal   Collection Time: 02/06/15  6:13 PM  Result Value Ref Range   Sodium 133 (L) 135 - 145 mmol/L   Potassium 3.5 3.5 - 5.1 mmol/L   Chloride 101 96 - 112 mmol/L   CO2 19 19 - 32 mmol/L   Glucose, Bld 119 (H) 70 - 99 mg/dL   BUN 25 (H) 6 - 23 mg/dL   Creatinine, Ser 4.23 (H) 0.50 - 1.10 mg/dL   Calcium 8.5 8.4 - 10.5 mg/dL   Total Protein 7.3 6.0 - 8.3 g/dL   Albumin 3.4 (L) 3.5 - 5.2 g/dL   AST 74 (H) 0 - 37 U/L   ALT 27 0 - 35 U/L   Alkaline Phosphatase 79 39 - 117 U/L   Total Bilirubin 0.9 0.3 - 1.2 mg/dL   GFR calc non Af Amer 13 (L) >90 mL/min   GFR calc Af Amer 15 (L) >90 mL/min    Comment: (NOTE) The eGFR has been calculated using the CKD EPI equation. This calculation has not been validated in all clinical situations. eGFR's persistently <90 mL/min signify possible Chronic Kidney Disease.  Anion gap 13 5 - 15  Procalcitonin     Status: None   Collection Time: 02/06/15  6:13 PM  Result Value Ref Range   Procalcitonin >200.00 ng/mL    Comment:        Interpretation: PCT >= 10 ng/mL: Important systemic inflammatory response, almost exclusively due to severe bacterial sepsis or septic shock. (NOTE)         ICU PCT Algorithm               Non ICU PCT Algorithm    ----------------------------     ------------------------------         PCT < 0.25 ng/mL                 PCT < 0.1 ng/mL     Stopping of antibiotics            Stopping of antibiotics       strongly encouraged.               strongly encouraged.    ----------------------------     ------------------------------       PCT level decrease by               PCT < 0.25 ng/mL       >= 80% from peak PCT       OR PCT 0.25 - 0.5 ng/mL          Stopping of antibiotics                                              encouraged.     Stopping of antibiotics           encouraged.    ----------------------------     ------------------------------       PCT level decrease by              PCT >= 0.25 ng/mL       < 80% from peak PCT        AND PCT >= 0.5 ng/mL             Continuing antibiotics                                              encouraged.       Continuing antibiotics            encouraged.    ----------------------------     ------------------------------     PCT level increase compared          PCT > 0.5 ng/mL         with peak PCT AND          PCT >= 0.5 ng/mL             Escalation of antibiotics                                          strongly encouraged.      Escalation of antibiotics        strongly encouraged.   I-Stat CG4 Lactic Acid, ED     Status: Abnormal   Collection Time:  02/06/15  6:40 PM  Result Value Ref Range   Lactic Acid, Venous 4.28 (HH) 0.5 - 2.0 mmol/L   Comment NOTIFIED PHYSICIAN   Urinalysis, Routine w reflex microscopic     Status: Abnormal   Collection Time: 02/06/15  8:21 PM  Result Value Ref Range   Color, Urine ORANGE (A) YELLOW    Comment: BIOCHEMICALS MAY BE AFFECTED BY COLOR   APPearance TURBID (A) CLEAR   Specific Gravity, Urine 1.024 1.005 - 1.030   pH 5.0 5.0 - 8.0   Glucose, UA 100 (A) NEGATIVE mg/dL   Hgb urine dipstick MODERATE (A) NEGATIVE   Bilirubin Urine MODERATE (A) NEGATIVE   Ketones, ur 15 (A) NEGATIVE mg/dL   Protein, ur 100 (A) NEGATIVE mg/dL   Urobilinogen, UA 1.0 0.0 - 1.0 mg/dL   Nitrite POSITIVE (A) NEGATIVE   Leukocytes, UA SMALL (A) NEGATIVE  Urine microscopic-add on     Status: Abnormal   Collection Time: 02/06/15  8:21 PM  Result Value Ref Range   Squamous Epithelial / LPF RARE RARE   WBC, UA 3-6 <3 WBC/hpf   RBC / HPF 11-20 <3 RBC/hpf   Bacteria, UA MANY (A) RARE   Urine-Other MUCOUS PRESENT     Comment: AMORPHOUS URATES/PHOSPHATES  POC Urine Pregnancy, ED (do NOT order at [facility])     Status: None   Collection Time:  02/06/15  8:31 PM  Result Value Ref Range   Preg Test, Ur NEGATIVE NEGATIVE    Comment:        THE SENSITIVITY OF THIS METHODOLOGY IS >24 mIU/mL   I-Stat CG4 Lactic Acid, ED (not at [facility])     Status: Abnormal   Collection Time: 02/06/15  9:06 PM  Result Value Ref Range   Lactic Acid, Venous 3.05 (HH) 0.5 - 2.0 mmol/L   Comment NOTIFIED PHYSICIAN   Lipase, blood     Status: None   Collection Time: 02/06/15  9:46 PM  Result Value Ref Range   Lipase 21 11 - 59 U/L  Basic metabolic panel     Status: Abnormal   Collection Time: 02/06/15  9:46 PM  Result Value Ref Range   Sodium 136 135 - 145 mmol/L   Potassium 3.3 (L) 3.5 - 5.1 mmol/L   Chloride 106 96 - 112 mmol/L   CO2 18 (L) 19 - 32 mmol/L   Glucose, Bld 95 70 - 99 mg/dL   BUN 24 (H) 6 - 23 mg/dL   Creatinine, Ser 3.84 (H) 0.50 - 1.10 mg/dL   Calcium 7.0 (L) 8.4 - 10.5 mg/dL   GFR calc non Af Amer 14 (L) >90 mL/min   GFR calc Af Amer 16 (L) >90 mL/min    Comment: (NOTE) The eGFR has been calculated using the CKD EPI equation. This calculation has not been validated in all clinical situations. eGFR's persistently <90 mL/min signify possible Chronic Kidney Disease.    Anion gap 12 5 - 15    Ct Abdomen Pelvis Wo Contrast  02/06/2015   CLINICAL DATA:  Acute onset of nausea and vomiting. Upper abdominal pain. Fever. Initial encounter.  EXAM: CT ABDOMEN AND PELVIS WITHOUT CONTRAST  TECHNIQUE: Multidetector CT imaging of the abdomen and pelvis was performed following the standard protocol without IV contrast.  COMPARISON:  None.  FINDINGS: The visualized lung bases are clear.  The liver is unremarkable in appearance. The spleen is enlarged, measuring 15.4 cm in length. The gallbladder is within normal limits. The pancreas and adrenal glands are  unremarkable.  Vague nonspecific perinephric stranding is noted bilaterally. This may be within normal limits, though pyelonephritis cannot be excluded, given the patient's fever and renal failure.  There is no evidence of hydronephrosis. No renal or ureteral stones are seen.  No free fluid is identified. The small bowel is unremarkable in appearance. The stomach is within normal limits. No acute vascular abnormalities are seen.  The appendix is normal in caliber, without evidence for appendicitis. There is diffuse fatty infiltration involving much of the wall of the colon, with sparing of the sigmoid colon, likely reflecting chronic sequelae of inflammation. The colon is otherwise unremarkable.  The bladder is mildly distended and grossly unremarkable. The uterus is within normal limits. The patient's intrauterine device is noted in expected position at the uterine fundus. No suspicious adnexal masses are seen. The ovaries are relatively symmetric. No inguinal lymphadenopathy is seen.  No acute osseous abnormalities are identified.  IMPRESSION: 1. Vague nonspecific bilateral perinephric stranding noted. This may be within normal limits, though pyelonephritis cannot be excluded, given the patient's fever and renal failure. 2. Splenomegaly noted. 3. Diffuse fatty infiltration involving much of the wall of the colon, with sparing of the sigmoid colon, likely reflecting chronic sequelae of prior inflammation.   Electronically Signed   By: Garald Balding M.D.   On: 02/06/2015 23:50   Dg Chest Port 1 View  02/06/2015   CLINICAL DATA:  Acute onset of generalized abdominal pain and vomiting. Dehydration. Initial encounter.  EXAM: PORTABLE CHEST - 1 VIEW  COMPARISON:  None.  FINDINGS: The lungs are hypoexpanded. Mild left basilar atelectasis is noted. There is no evidence of pleural effusion or pneumothorax.  The cardiomediastinal silhouette is borderline normal in size. No acute osseous abnormalities are seen.  IMPRESSION: Lungs hypoexpanded, with mild left basilar atelectasis.   Electronically Signed   By: Garald Balding M.D.   On: 02/06/2015 19:57    Review of Systems  Constitutional: Negative for weight loss.   HENT: Negative for ear discharge, ear pain, hearing loss and tinnitus.   Eyes: Negative for blurred vision, double vision, photophobia and pain.  Respiratory: Negative for cough, sputum production and shortness of breath.   Cardiovascular: Negative for chest pain.  Gastrointestinal: Positive for nausea, vomiting, abdominal pain and diarrhea.  Genitourinary: Negative for dysuria, urgency, frequency and flank pain.  Musculoskeletal: Negative for myalgias, back pain, joint pain, falls and neck pain.  Neurological: Negative for dizziness, tingling, sensory change, focal weakness, loss of consciousness and headaches.  Endo/Heme/Allergies: Does not bruise/bleed easily.  Psychiatric/Behavioral: Negative for depression, memory loss and substance abuse. The patient is not nervous/anxious.    Blood pressure 65/32, pulse 133, temperature 100.1 F (37.8 C), temperature source Oral, resp. rate 32, height _0  (1.702 m), weight 270 lb (122.471 kg), SpO2 100 %. Physical Exam Obese female - awake, alert HEENT:  EOMI, sclera anicteric; face is flushed Neck:  No masses, no thyromegaly Lungs:  CTA bilaterally; normal respiratory effort CV:  Regular rate and rhythm; no murmurs Abd: obese, soft; moderately tender in epigastrium and RUQ; no rebound, no guarding; no peritoneal signs Ext:  Well-perfused; no edema Skin:  Warm, dry; no sign of jaundice  Assessment/Plan: Nausea/ vomiting/ diarrhea with severe volume depletion - Cr 4.23, down to 3.84 with some hydration. Worsening abdominal pain - more localized to the right side - cholecystitis vs. Colitis.  At this time, there are no emergent indications for surgical intervention Urinalysis is positive - perinephric stranding on CT scan - ?  pyelonephritis Lactate improving slightly with volume resuscitation No obvious findings on non-IV contrast, non-PO contrast CT scan. Agree with [place] to evaluate gallbladder more thoroughly.  Not well visualized on non-contrast  CT.  Surgical team will continue to follow.  Santo Zahradnik K. 02/07/2015, 12:04 AM

## 2015-02-08 ENCOUNTER — Encounter (HOSPITAL_COMMUNITY): Payer: Self-pay | Admitting: General Practice

## 2015-02-08 ENCOUNTER — Inpatient Hospital Stay (HOSPITAL_COMMUNITY): Payer: Medicaid Other

## 2015-02-08 LAB — COMPREHENSIVE METABOLIC PANEL
ALBUMIN: 2.4 g/dL — AB (ref 3.5–5.2)
ALT: 29 U/L (ref 0–35)
ANION GAP: 10 (ref 5–15)
AST: 78 U/L — ABNORMAL HIGH (ref 0–37)
Alkaline Phosphatase: 57 U/L (ref 39–117)
BUN: 27 mg/dL — ABNORMAL HIGH (ref 6–23)
CALCIUM: 7 mg/dL — AB (ref 8.4–10.5)
CHLORIDE: 112 mmol/L (ref 96–112)
CO2: 22 mmol/L (ref 19–32)
CREATININE: 1.66 mg/dL — AB (ref 0.50–1.10)
GFR calc Af Amer: 45 mL/min — ABNORMAL LOW (ref >90)
GFR calc non Af Amer: 39 mL/min — ABNORMAL LOW (ref >90)
Glucose, Bld: 157 mg/dL — ABNORMAL HIGH (ref 70–99)
Potassium: 3.3 mmol/L — ABNORMAL LOW (ref 3.5–5.1)
Sodium: 144 mmol/L (ref 135–145)
Total Bilirubin: 0.5 mg/dL (ref 0.3–1.2)
Total Protein: 5.8 g/dL — ABNORMAL LOW (ref 6.0–8.3)

## 2015-02-08 LAB — CBC WITH DIFFERENTIAL/PLATELET
Basophils Absolute: 0 10*3/uL (ref 0.0–0.1)
Basophils Relative: 0 % (ref 0–1)
Eosinophils Absolute: 0 10*3/uL (ref 0.0–0.7)
Eosinophils Relative: 0 % (ref 0–5)
HEMATOCRIT: 31.1 % — AB (ref 36.0–46.0)
Hemoglobin: 10.3 g/dL — ABNORMAL LOW (ref 12.0–15.0)
LYMPHS PCT: 8 % — AB (ref 12–46)
Lymphs Abs: 1.2 10*3/uL (ref 0.7–4.0)
MCH: 27.6 pg (ref 26.0–34.0)
MCHC: 33.1 g/dL (ref 30.0–36.0)
MCV: 83.4 fL (ref 78.0–100.0)
MONO ABS: 0.8 10*3/uL (ref 0.1–1.0)
Monocytes Relative: 5 % (ref 3–12)
NEUTROS ABS: 13.3 10*3/uL — AB (ref 1.7–7.7)
Neutrophils Relative %: 87 % — ABNORMAL HIGH (ref 43–77)
Platelets: 96 10*3/uL — ABNORMAL LOW (ref 150–400)
RBC: 3.73 MIL/uL — AB (ref 3.87–5.11)
RDW: 16.1 % — ABNORMAL HIGH (ref 11.5–15.5)
WBC: 15.2 10*3/uL — AB (ref 4.0–10.5)

## 2015-02-08 LAB — PROCALCITONIN: Procalcitonin: 76.26 ng/mL

## 2015-02-08 LAB — CLOSTRIDIUM DIFFICILE BY PCR: CDIFFPCR: NEGATIVE

## 2015-02-08 MED ORDER — LEVOFLOXACIN IN D5W 750 MG/150ML IV SOLN: 750.0000 mg | INTRAVENOUS | Status: DC

## 2015-02-08 MED ORDER — SODIUM CHLORIDE 0.9 % IV SOLN
INTRAVENOUS | Status: DC
  Administered 2015-02-08: 18:00:00 via INTRAVENOUS

## 2015-02-08 MED ORDER — LEVOFLOXACIN IN D5W 750 MG/150ML IV SOLN
750.0000 mg | INTRAVENOUS | Status: DC
  Administered 2015-02-08 – 2015-02-09 (×2): 750 mg via INTRAVENOUS
  Filled 2015-02-08 (×3): qty 150

## 2015-02-08 MED ORDER — ACETAMINOPHEN 325 MG PO TABS
650.0000 mg | ORAL_TABLET | Freq: Four times a day (QID) | ORAL | Status: DC | PRN
  Administered 2015-02-08 – 2015-02-10 (×4): 650 mg via ORAL
  Filled 2015-02-08 (×4): qty 2

## 2015-02-08 MED ORDER — POTASSIUM CHLORIDE CRYS ER 20 MEQ PO TBCR
40.0000 meq | EXTENDED_RELEASE_TABLET | Freq: Once | ORAL | Status: AC
  Administered 2015-02-08: 40 meq via ORAL
  Filled 2015-02-08: qty 2

## 2015-02-08 NOTE — Progress Notes (Signed)
eLink Physician-Brief Progress Note Patient Name: Lisa Lindsey DOB: 08/09/1981 MRN: [id number]   Date of Service  02/08/2015  HPI/Events of Note  Called d/t patient transferred to medical/surgical floor. Still has orders for telemetry and continuous oximetry which were noted to be D/Ced in transfer note.  eICU Interventions  Will D/C telemetry and continuous oximetry.      Intervention Category Minor Interventions: Routine modifications to care plan (e.g. PRN medications for pain, fever)  Sommer,Steven Eugene 02/08/2015, 6:23 PM

## 2015-02-08 NOTE — H&P (Signed)
PULMONARY / CRITICAL CARE MEDICINE   Name: Idil Maslanka MRN: [id number] DOB: 1981/10/26    ADMISSION DATE:  02/06/2015 CONSULTATION DATE:  02/06/2015  REFERRING MD :  EDP  CHIEF COMPLAINT:  Abd pain  INITIAL PRESENTATION: 34 year old female with several day history of abdominal pain/nausea/vomiting. In ED she was profoundly hypotensive refractory to aggressive IVF resuscitation. PCCM to admit   STUDIES:  CT abd >>>Vague nonspecific bilateral perinephric stranding noted. This may be within normal limits, though pyelonephritis cannot be excluded, big spleen  SIGNIFICANT EVENTS: 3/1- abdo pain, septic shock, surgical consult 3/2- [place] abdo>>>Borderline prominence of the common bile duct  SUBJECTIVE: off pressors  VITAL SIGNS: Temp:  [97.7 F (36.5 C)-99 F (37.2 C)] 97.9 F (36.6 C) (03/03 0819) Pulse Rate:  [71-102] 73 (03/03 0600) Resp:  [14-28] 25 (03/03 0600) BP: (76-115)/(41-85) 100/72 mmHg (03/03 0600) SpO2:  [94 %-100 %] 100 % (03/03 0600) HEMODYNAMICS:   VENTILATOR SETTINGS:   INTAKE / OUTPUT:  Intake/Output Summary (Last 24 hours) at 02/08/15 1001 Last data filed at 02/08/15 0600  Gross per 24 hour  Intake   3222 ml  Output   2245 ml  Net    977 ml    PHYSICAL EXAMINATION: General:  Obese female in no resp distress Neuro:  Alert, oriented, non focal HEENT: obese Cardiovascular:  RRR, no MRG Lungs:  CTA Abdomen:  Soft, NT, no rebound, no guard today, BS improved Musculoskeletal:  No acute deformity Skin:  Grossly intact  LABS:  CBC  Recent Labs Lab 02/07/15 0219 02/07/15 0500 02/08/15 0300  WBC 19.1* 26.1* 15.2*  HGB 10.0* 10.8* 10.3*  HCT 30.2* 33.2* 31.1*  PLT PLATELET CLUMPS NOTED ON SMEAR, UNABLE TO ESTIMATE 121* 96*   Coag's No results for input(s): APTT, INR in the last 168 hours. BMET  Recent Labs Lab 02/07/15 1500 02/07/15 2250 02/08/15 0300  NA 145 145 144  K 4.0 3.3* 3.3*  CL 115* 116* 112  CO2 BUN 23 24* 27*   CREATININE 2.00* 1.70* 1.66*  GLUCOSE 164* 157* 157*   Electrolytes  Recent Labs Lab 02/07/15 1500 02/07/15 2250 02/08/15 0300  CALCIUM 7.1* 7.2* 7.0*   Sepsis Markers  Recent Labs Lab 02/06/15 1813 02/06/15 1840 02/06/15 2106 02/07/15 0200 02/07/15 0500 02/08/15 0500  LATICACIDVEN  --  4.28* 3.05* 1.9  --   --   PROCALCITON >200.00  --   --   --  84.80 76.26   ABG  Recent Labs Lab 02/06/15 2338  PHART 7.366  PCO2ART 26.5*  PO2ART 204.0*   Liver Enzymes  Recent Labs Lab 02/06/15 1813 02/07/15 0220 02/08/15 0300  AST 74* 69* 78*  ALT ALKPHOS 79 66 57  BILITOT 0.9 0.8 0.5  ALBUMIN 3.4* 2.4* 2.4*   Cardiac Enzymes  Recent Labs Lab 02/07/15 0218 02/07/15 0500 02/07/15 1055  TROPONINI 0.08* 0.06* 0.04*   Glucose  Recent Labs Lab 02/07/15 0037 02/07/15 0850  GLUCAP 86 158*    Imaging US Abdomen Complete  02/07/2015   CLINICAL DATA:  Diffuse left upper quadrant abdominal, nausea, vomiting and diarrhea. Fever. Elevated creatinine. Initial encounter.  EXAM: ULTRASOUND ABDOMEN COMPLETE  COMPARISON:  CT of the abdomen and pelvis performed 02/06/2015  FINDINGS: Gallbladder: No gallstones or wall thickening visualized. No sonographic Murphy sign noted.  Common bile duct: Diameter: 0.7 cm; this is borderline prominent, though still thought to remain within normal limits, given the lack of significant dilatation on  recent CT.  Liver: No focal lesion identified. Within normal limits in parenchymal echogenicity, though somewhat coarsened in echotexture.  IVC: No abnormality visualized.  Pancreas: Visualized portion unremarkable.  Spleen:  Known splenomegaly is not well characterized on ultrasound.  Right Kidney: Length: 10.9 cm. Echogenicity within normal limits. No mass or hydronephrosis visualized. Evaluation is suboptimal due to limitations in positioning.  Left Kidney: Length: 13.5 cm. Echogenicity within normal limits. No mass or hydronephrosis  visualized. Evaluation is suboptimal due to limitations in positioning.  Abdominal aorta: No aneurysm visualized. Not well characterized distally due to overlying bowel gas.  Other findings: None.  IMPRESSION: 1. Gallbladder unremarkable in appearance. 2. Borderline prominence of the common bile duct, measuring up to 0.7 cm. This thought to still remain within normal limits, given the normal appearance on recent CT. 3. Known splenomegaly is not well characterized on ultrasound. 4. Kidneys difficult to fully assess due to limitations in positioning.   Electronically Signed   By: Roanna RaiderJeffery  Chang M.D.   On: 02/07/2015 02:26     ASSESSMENT / PLAN:  PULMONARY A: At risk pulm edema  P:   Supplemental O2 to keep sats > 92% May need lasix abg assessment - wnl  CARDIOVASCULAR CVL LIJ 3/1 >> A:  Septic shock P:  Dc line neck Dc tele  RENAL A:   AKI, suspect prerenal / atn etiology in setting shock (improving) NONAG from saline P:   k supp kvo May need lasix Dc bicarb  GASTROINTESTINAL A:   Viral gastroenteritis leading to hypoperfusion from profound hypovolemia  P:   Dc ngt Start fulls  HEMATOLOGIC A:   DVT prevention Leukocytosis Drop plat (sepsis, dilution) P:  Follow Bmet in am  Sub q hep, may need to hold  INFECTIOUS A:   Septic shock suspect intraabdominal etiology Concern is bacterial translocation after viral illness UTI unimpressive R/o pyelo (UA neg)  P:   BCx2 3/1 > UC 3/1 > Cdif > Viral panel 3/1 > Abx: Aztreonam, start date 3/1 >3/3 Abx: Vancomycin, start date 3/1 >3/3 Abx: Flagyl, start date 3/1 >>3/2 Abx: Levaquin, start date 3/1 > Diflucan, start date 3/1 >3/2  Dc all abx except levo, plan 8 days  ENDOCRINE A:  R/o rel AI    P:   Cortisol high ,m dc steroids tsh repeat in 1 week  NEUROLOGIC A:   No acute issues  P:   RASS goal: 0 Monitor for pain ambulate  FAMILY  - Updates:   - Inter-disciplinary family meet or Palliative  Care meeting due by:  3/8  To floor triad  Mcarthur Rossettianiel J. Tyson AliasFeinstein, MD, FACP Pgr: [phone] [facility]

## 2015-02-08 NOTE — Progress Notes (Signed)
NG tube at low intermittent suction with minimal output.  Patient requested NG tube be removed, MD notified.  NG tube clamped for 2 hours, per MD.  Patient complained of nausea while NG tube was clamped.  NG returned to low intermittent suction.  Will continue to monitor.

## 2015-02-09 DIAGNOSIS — A09 Infectious gastroenteritis and colitis, unspecified: Secondary | ICD-10-CM

## 2015-02-09 DIAGNOSIS — D72829 Elevated white blood cell count, unspecified: Secondary | ICD-10-CM

## 2015-02-09 DIAGNOSIS — R6521 Severe sepsis with septic shock: Secondary | ICD-10-CM

## 2015-02-09 LAB — BASIC METABOLIC PANEL
Anion gap: 6 (ref 5–15)
BUN: 24 mg/dL — ABNORMAL HIGH (ref 6–23)
CHLORIDE: 113 mmol/L — AB (ref 96–112)
CO2: 27 mmol/L (ref 19–32)
Calcium: 7.4 mg/dL — ABNORMAL LOW (ref 8.4–10.5)
Creatinine, Ser: 1.2 mg/dL — ABNORMAL HIGH (ref 0.50–1.10)
GFR calc Af Amer: 67 mL/min — ABNORMAL LOW (ref >90)
GFR calc non Af Amer: 58 mL/min — ABNORMAL LOW (ref >90)
GLUCOSE: 101 mg/dL — AB (ref 70–99)
Potassium: 3.6 mmol/L (ref 3.5–5.1)
SODIUM: 146 mmol/L — AB (ref 135–145)

## 2015-02-09 LAB — MAGNESIUM: MAGNESIUM: 1.9 mg/dL (ref 1.5–2.5)

## 2015-02-09 LAB — PHOSPHORUS: PHOSPHORUS: 2.6 mg/dL (ref 2.3–4.6)

## 2015-02-09 MED ORDER — SACCHAROMYCES BOULARDII 250 MG PO CAPS
250.0000 mg | ORAL_CAPSULE | Freq: Two times a day (BID) | ORAL | Status: DC
  Administered 2015-02-09 – 2015-02-10 (×3): 250 mg via ORAL
  Filled 2015-02-09 (×4): qty 1

## 2015-02-09 MED ORDER — PANTOPRAZOLE SODIUM 40 MG PO TBEC
40.0000 mg | DELAYED_RELEASE_TABLET | Freq: Every day | ORAL | Status: DC
Start: 2015-02-09 — End: 2015-02-10
  Administered 2015-02-09 – 2015-02-10 (×2): 40 mg via ORAL
  Filled 2015-02-09 (×2): qty 1

## 2015-02-09 NOTE — Progress Notes (Signed)
[facility] PROGRESS NOTE  Lisa Lindsey WJX:[id number] DOB: 10/21/1981 DOA: 02/06/2015 PCP: PROVIDER NOT IN SYSTEM  Assessment/Plan: 1. Septic shock from possible intra-abdominal etiology 1. Critical care recs to complete empiric levaquin, stop date on 3/9 2. Shock resolved 3. Leukocytosis improving 4. Cont with supportive care 5. On further questioning, pt reports multiple family members have same n/v/d symptoms at home. Possible infectious etiology. Will check stool culture 6. Cdiff is neg 2. ARF 1. Cr is improving with hydration 2. Continue to monitor renal function 3. Suspected gastroenteritis with hypovolemia 1. Pt remains with nausea 2. Cont supportive care 3. Will check stool culture per above 4. DVT prophylaxis 1. Heparin subQ  Code Status: Full Family Communication: Pt in room (indicate person spoken with, relationship, and if by phone, the number) Disposition Plan: pending   Consultants:  Critical Care  Procedures:    Antibiotics: Abx: Aztreonam, start date 3/1 >3/3 Abx: Vancomycin, start date 3/1 >3/3 Abx: Flagyl, start date 3/1 >>3/2 Abx: Levaquin, start date 3/1 >>>Anticipate through 3/9 Diflucan, start date 3/1 >3/2  HPI/Subjective: Still nauseated, but feels better  Objective: Filed Vitals:   02/08/15 2126 02/09/15 0500 02/09/15 0701 02/09/15 1324  BP: 94/61  93/58 90/56  Pulse: 71  72 77  Temp: 98.2 F (36.8 C)  97.7 F (36.5 C) 97.9 F (36.6 C)  TempSrc: Oral  Oral Oral  Resp: Height:      Weight:  134.265 kg (296 lb)    SpO2: 100%  100% 98%    Intake/Output Summary (Last 24 hours) at 02/09/15 1624 Last data filed at 02/09/15 1015  Gross per 24 hour  Intake    750 ml  Output      0 ml  Net    750 ml   Filed Weights   02/07/15 0500 02/08/15 1827 02/09/15 0500  Weight: 140.161 kg (309 lb) 134.038 kg (295 lb 8 oz) 134.265 kg (296 lb)    Exam:   General:  Awake, in nad  Cardiovascular: regular, s1,  s2  Respiratory: normal resp effort,no wheezing  Abdomen: soft,nondistended  Musculoskeletal: perfused, no clubbing   Data Reviewed: Basic Metabolic Panel:  Recent Labs Lab 02/07/15 0544 02/07/15 1500 02/07/15 2250 02/08/15 0300 02/09/15 0522  NA 137 145 145 144 146*  K 3.3* 4.0 3.3* 3.3* 3.6  CL 110 115* 116* 112 113*  CO2 17* GLUCOSE 160* 164* 157* 157* 101*  BUN 24* 23 24* 27* 24*  CREATININE 2.81* 2.00* 1.70* 1.66* 1.20*  CALCIUM 7.0* 7.1* 7.2* 7.0* 7.4*  MG  --   --   --   --  1.9  PHOS  --   --   --   --  2.6   Liver Function Tests:  Recent Labs Lab 02/06/15 1813 02/07/15 0220 02/08/15 0300  AST 74* 69* 78*  ALT ALKPHOS 79 66 57  BILITOT 0.9 0.8 0.5  PROT 7.3 5.2* 5.8*  ALBUMIN 3.4* 2.4* 2.4*    Recent Labs Lab 02/06/15 2146 02/07/15 0220  LIPASE 21  --   AMYLASE  --  49   No results for input(s): AMMONIA in the last 168 hours. CBC:  Recent Labs Lab 02/06/15 1813 02/07/15 0219 02/07/15 0500 02/08/15 0300  WBC 20.9* 19.1* 26.1* 15.2*  NEUTROABS  --  16.7*  --  13.3*  HGB 13.4 10.0* 10.8* 10.3*  HCT 40.4 30.2* 33.2* 31.1*  MCV 83.5 83.0 83.4 83.4  PLT 191 PLATELET CLUMPS NOTED ON SMEAR, UNABLE TO ESTIMATE 121* 96*   Cardiac Enzymes:  Recent Labs Lab 02/07/15 0218 02/07/15 0500 02/07/15 1055  TROPONINI 0.08* 0.06* 0.04*   BNP (last 3 results) No results for input(s): BNP in the last 8760 hours.  ProBNP (last 3 results) No results for input(s): PROBNP in the last 8760 hours.  CBG:  Recent Labs Lab 02/07/15 0037 02/07/15 0850  GLUCAP 86 158*    Recent Results (from the past 240 hour(s))  Blood Culture (routine x 2)     Status: None (Preliminary result)   Collection Time: 02/06/15  7:50 PM  Result Value Ref Range Status   Specimen Description BLOOD ARM RIGHT  Final   Special Requests BOTTLES DRAWN AEROBIC ONLY 7CC  Final   Culture   Final           BLOOD CULTURE RECEIVED NO GROWTH TO DATE CULTURE  WILL BE HELD FOR 5 DAYS BEFORE ISSUING A FINAL NEGATIVE REPORT Performed at [facility]    Report Status PENDING  Incomplete  Blood Culture (routine x 2)     Status: None (Preliminary result)   Collection Time: 02/06/15  7:58 PM  Result Value Ref Range Status   Specimen Description BLOOD RIGHT FOREARM  Final   Special Requests BOTTLES DRAWN AEROBIC ONLY 2CC  Final   Culture   Final           BLOOD CULTURE RECEIVED NO GROWTH TO DATE CULTURE WILL BE HELD FOR 5 DAYS BEFORE ISSUING A FINAL NEGATIVE REPORT Performed at [facility]    Report Status PENDING  Incomplete  Urine culture     Status: None   Collection Time: 02/06/15  8:21 PM  Result Value Ref Range Status   Specimen Description URINE, CATHETERIZED  Final   Special Requests Normal  Final   Colony Count NO GROWTH Performed at [facility]   Final   Culture NO GROWTH Performed at [facility]   Final   Report Status 02/07/2015 FINAL  Final  MRSA PCR Screening     Status: Abnormal   Collection Time: 02/07/15 12:28 AM  Result Value Ref Range Status   MRSA by PCR POSITIVE (A) NEGATIVE Final    Comment:        The GeneXpert MRSA Assay (FDA approved for NASAL specimens only), is one component of a comprehensive MRSA colonization surveillance program. It is not intended to diagnose MRSA infection nor to guide or monitor treatment for MRSA infections. RESULT CALLED TO, READ BACK BY AND VERIFIED WITH: HANCOCK,M RN 1610 02/07/15 MITCHELL,L   Clostridium Difficile by PCR     Status: None   Collection Time: 02/08/15  2:54 AM  Result Value Ref Range Status   C difficile by pcr NEGATIVE NEGATIVE Final     Studies: Dg Chest Port 1 View  02/08/2015   CLINICAL DATA:  Sepsis.  EXAM: PORTABLE CHEST - 1 VIEW  COMPARISON:  02/06/2015.  FINDINGS: Support apparatus: LEFT IJ central line is unchanged with the tip in the brachiocephalic vein. Introduction of an enteric tube with the tip not visible.  Monitoring leads project over the chest.  Cardiomediastinal Silhouette:  Unchanged.  Lungs: Mild basilar atelectasis.  No pneumothorax.  Effusions:  None.  Other:  None.  IMPRESSION: 1. Interval introduction of enteric tube. LEFT IJ central line unchanged. 2. Mild basilar atelectasis.   Electronically Signed   By: Andreas Newport M.D.   On: 02/08/2015 07:24  Scheduled Meds: . antiseptic oral rinse  7 mL Mouth Rinse q12n4p  . Chlorhexidine Gluconate Cloth  6 each Topical Q0600  . heparin  5,000 Units Subcutaneous 3 times per day  . levofloxacin (LEVAQUIN) IV  750 mg Intravenous Q24H  . mupirocin ointment  1 application Nasal BID  . ondansetron (ZOFRAN) IV  4 mg Intravenous Once  . pantoprazole  40 mg Oral Q1200  . saccharomyces boulardii  250 mg Oral BID   Continuous Infusions: . sodium chloride 50 mL/hr at 02/08/15 1820  .  sodium bicarbonate  infusion 1000 mL 10 mL/hr at 02/08/15 1100    Active Problems:   Septic shock   CHIU, STEPHEN K  [facility] Pager [phone]. If 7PM-7AM, please contact night-coverage at [email or web address], password [email or web address] 02/09/2015, 4:24 PM  LOS: 3 days

## 2015-02-09 NOTE — Progress Notes (Signed)
ANTIBIOTIC CONSULT NOTE - FOLLOW UP  Pharmacy Consult for Levaquin Indication: rule out sepsis  Allergies  Allergen Reactions  . Penicillins Other (See Comments)    childhood    Patient Measurements: Height: 5\' 7"  (170.2 cm) Weight: 296 lb (134.265 kg) IBW/kg (Calculated) : 61.6 Adjusted Body Weight:   Vital Signs: Temp: 97.7 F (36.5 C) (03/04 0701) Temp Source: Oral (03/04 0701) BP: 93/58 mmHg (03/04 0701) Pulse Rate: 72 (03/04 0701) Intake/Output from previous day: 03/03 0701 - 03/04 0700 In: 744.7 [P.O.:120; I.V.:474.7; IV Piggyback:150] Out: 280 [Urine:280] Intake/Output from this shift: Total I/O In: 240 [P.O.:240] Out: -   Labs:  Recent Labs  02/07/15 0219 02/07/15 0500  02/07/15 2250 02/08/15 0300 02/09/15 0522  WBC 19.1* 26.1*  --   --  15.2*  --   HGB 10.0* 10.8*  --   --  10.3*  --   PLT PLATELET CLUMPS NOTED ON SMEAR, UNABLE TO ESTIMATE 121*  --   --  96*  --   CREATININE 3.24*  --   < > 1.70* 1.66* 1.20*  < > = values in this interval not displayed. Estimated Creatinine Clearance: 93.7 mL/min (by C-G formula based on Cr of 1.2). No results for input(s): VANCOTROUGH, VANCOPEAK, VANCORANDOM, GENTTROUGH, GENTPEAK, GENTRANDOM, TOBRATROUGH, TOBRAPEAK, TOBRARND, AMIKACINPEAK, AMIKACINTROU, AMIKACIN in the last 72 hours.   Microbiology: Recent Results (from the past 720 hour(s))  Blood Culture (routine x 2)     Status: None (Preliminary result)   Collection Time: 02/06/15  7:50 PM  Result Value Ref Range Status   Specimen Description BLOOD ARM RIGHT  Final   Special Requests BOTTLES DRAWN AEROBIC ONLY 7CC  Final   Culture   Final           BLOOD CULTURE RECEIVED NO GROWTH TO DATE CULTURE WILL BE HELD FOR 5 DAYS BEFORE ISSUING A FINAL NEGATIVE REPORT [facility]    Report Status PENDING  Incomplete  Blood Culture (routine x 2)     Status: None (Preliminary result)   Collection Time: 02/06/15  7:58 PM  Result Value Ref Range  Status   Specimen Description BLOOD RIGHT FOREARM  Final   Special Requests BOTTLES DRAWN AEROBIC ONLY 2CC  Final   Culture   Final           BLOOD CULTURE RECEIVED NO GROWTH TO DATE CULTURE WILL BE HELD FOR 5 DAYS BEFORE ISSUING A FINAL NEGATIVE REPORT [facility]    Report Status PENDING  Incomplete  Urine culture     Status: None   Collection Time: 02/06/15  8:21 PM  Result Value Ref Range Status   Specimen Description URINE, CATHETERIZED  Final   Special Requests Normal  Final   Colony Count NO GROWTH [facility]   Final   Culture NO GROWTH [facility]   Final   Report Status 02/07/2015 FINAL  Final  MRSA PCR Screening     Status: Abnormal   Collection Time: 02/07/15 12:28 AM  Result Value Ref Range Status   MRSA by PCR POSITIVE (A) NEGATIVE Final    Comment:        The GeneXpert MRSA Assay (FDA approved for NASAL specimens only), is one component of a comprehensive MRSA colonization surveillance program. It is not intended to diagnose MRSA infection nor to guide or monitor treatment for MRSA infections. RESULT CALLED TO, READ BACK BY AND VERIFIED WITH: HANCOCK,M RN [phone]  02/07/15 MITCHELL,L   Clostridium Difficile by PCR     Status: None   Collection Time: 02/08/15  2:54 AM  Result Value Ref Range Status   C difficile by pcr NEGATIVE NEGATIVE Final    Anti-infectives    Start     Dose/Rate Route Frequency Ordered Stop   02/08/15 2100  vancomycin (VANCOCIN) 1,750 mg in sodium chloride 0.9 % 500 mL IVPB  Status:  Discontinued     1,750 mg 250 mL/hr over 120 Minutes Intravenous Every 48 hours 02/06/15 2013 02/07/15 1216   02/08/15 2100  levofloxacin (LEVAQUIN) IVPB 500 mg  Status:  Discontinued     500 mg 100 mL/hr over 60 Minutes Intravenous Every 48 hours 02/06/15 2013 02/07/15 1220   02/08/15 2100  levofloxacin (LEVAQUIN) IVPB 750 mg  Status:  Discontinued     750 mg 100 mL/hr over 90 Minutes  Intravenous Every 48 hours 02/07/15 1220 02/08/15 1006   02/08/15 2100  levofloxacin (LEVAQUIN) IVPB 750 mg  Status:  Discontinued     750 mg 100 mL/hr over 90 Minutes Intravenous Every 48 hours 02/08/15 1006 02/08/15 1017   02/08/15 2100  levofloxacin (LEVAQUIN) IVPB 750 mg     750 mg 100 mL/hr over 90 Minutes Intravenous Every 24 hours 02/08/15 1017 02/14/15 2059   02/07/15 2000  vancomycin (VANCOCIN) 1,500 mg in sodium chloride 0.9 % 500 mL IVPB  Status:  Discontinued     1,500 mg 250 mL/hr over 120 Minutes Intravenous Every 24 hours 02/07/15 1216 02/08/15 1006   02/07/15 0600  aztreonam (AZACTAM) 1 g in dextrose 5 % 50 mL IVPB  Status:  Discontinued     1 g 100 mL/hr over 30 Minutes Intravenous 3 times per day 02/06/15 2013 02/08/15 1006   02/06/15 2330  metroNIDAZOLE (FLAGYL) IVPB 500 mg  Status:  Discontinued     500 mg 100 mL/hr over 60 Minutes Intravenous Every 8 hours 02/06/15 2241 02/07/15 0842   02/06/15 2300  fluconazole (DIFLUCAN) IVPB 200 mg  Status:  Discontinued     200 mg 100 mL/hr over 60 Minutes Intravenous Every 24 hours 02/06/15 2241 02/07/15 0836   02/06/15 2015  vancomycin (VANCOCIN) IVPB 750 mg/150 ml premix     750 mg 150 mL/hr over 60 Minutes Intravenous NOW 02/06/15 2013 02/07/15 2015   02/06/15 1945  levofloxacin (LEVAQUIN) IVPB 750 mg     750 mg 100 mL/hr over 90 Minutes Intravenous  Once 02/06/15 1944 02/07/15 0008   02/06/15 1945  aztreonam (AZACTAM) 2 g in dextrose 5 % 50 mL IVPB     2 g 100 mL/hr over 30 Minutes Intravenous  Once 02/06/15 1944 02/07/15 0008   02/06/15 1945  vancomycin (VANCOCIN) IVPB 1000 mg/200 mL premix     1,000 mg 200 mL/hr over 60 Minutes Intravenous  Once 02/06/15 1944 02/06/15 2140      Assessment: N/V/D with syncopal episode  AC: None PTA, heparin SQ for VTE proph. Plts down to 96 on 3/3 (baseline 191). CBC needs to be repeated.  ID: Levaquin D#4 for sepsis, r/o Pyelo - . LA 4.28>>1.9. WBC up 26.1, Tm 103.7 now afebrile x  24h, PCT 84.8>76.2 (3/3).  WBC 15.2. Down. MRSA pcr +. Surgery on board- no current interventions. Scr 1.2 down.  Diflucan 3/1 >>3/2 Flagyl 3/1 >>3/2 Vanc 3/1>>3/3 Levaquin 3/1 >> Aztreonam 3/1 >>3/3  3/2 RVP >> pending 3/1 Urine >>NEG 3/1 BCx >>NGTD 3/1 Cdiff - NEG  CV: BP low 93/58, HR 72.  No CV meds.  Endo: TSH slightly elevated, AM gluc ok - Cortisol 77 but drawn 1hr after steroid dose  GI/Nutr: Full liquid diet. AST mildly elevated - CT Abd- pyelonephritis cannot be excluded, large spleen, po PPI, Florastor  Neuro: A&O,  Renal: Scr 1.66>1.2, K 3.6 (replaced) - bicarb gtt KVO  Pulm: 100% RA  Heme/Onc: H/H 10.3/31.1, plts 96 (possibly dilutional?). Not repeated 10/4.  PTA Med: Mirena IU  Best Practices: MC, sq heparin, PPI IV  Plan:  - Continue levaquin  IV Q24H - F/u renal fxn, C&S, clinical status and ability to change to PO - Watch platelets with SQ heparin  Wally Behan S. Merilynn Finland, PharmD, [facility] Clinical Staff Pharmacist Pager [phone]  Misty Stanley Stillinger 02/09/2015,10:17 AM

## 2015-02-10 DIAGNOSIS — A09 Infectious gastroenteritis and colitis, unspecified: Secondary | ICD-10-CM

## 2015-02-10 LAB — COMPREHENSIVE METABOLIC PANEL
ALK PHOS: 50 U/L (ref 39–117)
ALT: 40 U/L — ABNORMAL HIGH (ref 0–35)
AST: 64 U/L — ABNORMAL HIGH (ref 0–37)
Albumin: 2.3 g/dL — ABNORMAL LOW (ref 3.5–5.2)
Anion gap: 5 (ref 5–15)
BILIRUBIN TOTAL: 0.4 mg/dL (ref 0.3–1.2)
BUN: 15 mg/dL (ref 6–23)
CHLORIDE: 109 mmol/L (ref 96–112)
CO2: 30 mmol/L (ref 19–32)
Calcium: 7.7 mg/dL — ABNORMAL LOW (ref 8.4–10.5)
Creatinine, Ser: 1.05 mg/dL (ref 0.50–1.10)
GFR calc Af Amer: 79 mL/min — ABNORMAL LOW (ref >90)
GFR, EST NON AFRICAN AMERICAN: 68 mL/min — AB (ref >90)
Glucose, Bld: 83 mg/dL (ref 70–99)
Potassium: 3.4 mmol/L — ABNORMAL LOW (ref 3.5–5.1)
Sodium: 144 mmol/L (ref 135–145)
TOTAL PROTEIN: 5.2 g/dL — AB (ref 6.0–8.3)

## 2015-02-10 LAB — CBC
HEMATOCRIT: 30.4 % — AB (ref 36.0–46.0)
HEMOGLOBIN: 9.7 g/dL — AB (ref 12.0–15.0)
MCH: 27.1 pg (ref 26.0–34.0)
MCHC: 31.9 g/dL (ref 30.0–36.0)
MCV: 84.9 fL (ref 78.0–100.0)
PLATELETS: 82 10*3/uL — AB (ref 150–400)
RBC: 3.58 MIL/uL — ABNORMAL LOW (ref 3.87–5.11)
RDW: 15.9 % — ABNORMAL HIGH (ref 11.5–15.5)
WBC: 4.1 10*3/uL (ref 4.0–10.5)

## 2015-02-10 MED ORDER — SACCHAROMYCES BOULARDII 250 MG PO CAPS: 250.0000 mg | ORAL_CAPSULE | Freq: Two times a day (BID) | ORAL | Status: DC

## 2015-02-10 MED ORDER — LEVOFLOXACIN 750 MG PO TABS: 750.0000 mg | ORAL_TABLET | Freq: Every day | ORAL | Status: DC

## 2015-02-10 MED ORDER — POTASSIUM CHLORIDE CRYS ER 20 MEQ PO TBCR
40.0000 meq | EXTENDED_RELEASE_TABLET | Freq: Once | ORAL | Status: AC
  Administered 2015-02-10: 40 meq via ORAL
  Filled 2015-02-10: qty 2

## 2015-02-10 MED ORDER — ONDANSETRON HCL 4 MG PO TABS: 4.0000 mg | ORAL_TABLET | Freq: Three times a day (TID) | ORAL | Status: DC | PRN

## 2015-02-10 NOTE — Progress Notes (Signed)
Pt. Escorted to car via W/C by NT.  Forbes Cellarelcine Lucretia Pendley, RN

## 2015-02-10 NOTE — Progress Notes (Signed)
Lisa BertinAimee Lindsey discharged Home per MD order.  Discharge instructions reviewed and discussed with the patient, all questions and concerns answered. Copy of instructions, care notes and scripts given to patient.    Medication List    STOP taking these medications        azithromycin 250 MG tablet  Commonly known as:  ZITHROMAX     methocarbamol 500 MG tablet  Commonly known as:  ROBAXIN      TAKE these medications        levofloxacin 750 MG tablet  Commonly known as:  LEVAQUIN  Take 1 tablet (750 mg total) by mouth daily.     levonorgestrel 20 MCG/24HR IUD  Commonly known as:  MIRENA  1 each by Intrauterine route once. June 2015     ondansetron 4 MG tablet  Commonly known as:  ZOFRAN  Take 1 tablet (4 mg total) by mouth every 8 (eight) hours as needed for nausea or vomiting.     saccharomyces boulardii 250 MG capsule  Commonly known as:  FLORASTOR  Take 1 capsule (250 mg total) by mouth 2 (two) times daily.        Patients skin is clean, dry and intact, no evidence of skin break down. IV site discontinued and catheter remains intact. Site without signs and symptoms of complications. Dressing and pressure applied.  Patient will be escorted to car by NT in a wheelchair when ride comes,  no distress noted upon discharge.  Laural BenesJohnson, [name] 02/10/2015 6:17 PM

## 2015-02-10 NOTE — Plan of Care (Signed)
Problem: Phase I Progression Outcomes Goal: Initial discharge plan identified Outcome: Completed/Met Date Met:  02/10/15 To return home

## 2015-02-10 NOTE — Discharge Summary (Signed)
Physician Discharge Summary  Lisa Lindsey ZOX:[id number] DOB: 26-Sep-1981 DOA: 02/06/2015  PCP: PROVIDER NOT IN SYSTEM  Admit date: 02/06/2015 Discharge date: 02/10/2015  Time spent: 25 minutes  Recommendations for Outpatient Follow-up:  1. Follow up with PCP in 1-2 weeks  Discharge Diagnoses:  Principal Problem:   Septic shock Active Problems:   Acute kidney injury   Dehydration   Nausea vomiting and diarrhea   Gastroenteritis, infectious   Discharge Condition: Improved  Diet recommendation: Soft diet, advance to regular as tolerated  Filed Weights   02/08/15 1827 02/09/15 0500 02/10/15 0500  Weight: 134.038 kg (295 lb 8 oz) 134.265 kg (296 lb) 131.226 kg (289 lb 4.8 oz)    History of present illness:  Please review h and p from 3/1 for details. Briefly, pt presented with abd pain with n/v and found to be hypotensive refractory to aggressive IVF. Pt was admitted to the ICU for further work up.  Hospital Course:  1. Septic shock from possible intra-abdominal etiology 1. Critical care recs to complete empiric levaquin, stop date on 3/9 2. Shock resolved during this admission 3. Leukocytosis normalized 4. On further questioning, pt reports multiple family members have same n/v/d symptoms at home. Suspect likely infectious etiology. 5. Cdiff is neg 2. ARF 1. Cr improved with hydration 3. Suspected gastroenteritis with hypovolemia 1. Pt with residual mild nausea but has been steadily improving 2. Pt's diet was successfully advanced to soft, to continue to advance after discharge 3. Strict hand washing recommended to patient 4. DVT prophylaxis 1. Heparin subQ  Consultations:  Critical Care  Discharge Exam: Filed Vitals:   02/09/15 1324 02/09/15 2140 02/10/15 0500 02/10/15 0526  BP: 90/56 90/55  92/50  Pulse: 77 74  70  Temp: 97.9 F (36.6 C) 97.6 F (36.4 C)  97.5 F (36.4 C)  TempSrc: Oral Oral  Oral  Resp: Height:      Weight:   131.226 kg (289 lb 4.8  oz)   SpO2: 98% 100%  97%    General: awake, in nad Cardiovascular: regular, s1, s2 Respiratory: normal resp effort, no wheezing  Discharge Instructions     Medication List    STOP taking these medications        azithromycin 250 MG tablet  Commonly known as:  ZITHROMAX     methocarbamol 500 MG tablet  Commonly known as:  ROBAXIN      TAKE these medications        levofloxacin 750 MG tablet  Commonly known as:  LEVAQUIN  Take 1 tablet (750 mg total) by mouth daily.     levonorgestrel 20 MCG/24HR IUD  Commonly known as:  MIRENA  1 each by Intrauterine route once. June 2015     ondansetron 4 MG tablet  Commonly known as:  ZOFRAN  Take 1 tablet (4 mg total) by mouth every 8 (eight) hours as needed for nausea or vomiting.     saccharomyces boulardii 250 MG capsule  Commonly known as:  FLORASTOR  Take 1 capsule (250 mg total) by mouth 2 (two) times daily.       Allergies  Allergen Reactions  . Penicillins Other (See Comments)    childhood   Follow-up Information    Follow up with  [facility]     On 02/14/2015.   Why:  11:15 am for hospital follow up , bring $20 co pay and photo id   Contact information:   [address] [place] [address] [phone]       The results of significant diagnostics from this hospitalization (including imaging, microbiology, ancillary and laboratory) are listed below for reference.    Significant Diagnostic Studies: Ct Abdomen Pelvis Wo Contrast  02/06/2015   CLINICAL DATA:  Acute onset of nausea and vomiting. Upper abdominal pain. Fever. Initial encounter.  EXAM: CT ABDOMEN AND PELVIS WITHOUT CONTRAST  TECHNIQUE: Multidetector CT imaging of the abdomen and pelvis was performed following the standard protocol without IV contrast.  COMPARISON:  None.  FINDINGS: The visualized lung bases are clear.  The liver is unremarkable in appearance. The spleen is enlarged, measuring 15.4 cm in  length. The gallbladder is within normal limits. The pancreas and adrenal glands are unremarkable.  Vague nonspecific perinephric stranding is noted bilaterally. This may be within normal limits, though pyelonephritis cannot be excluded, given the patient's fever and renal failure. There is no evidence of hydronephrosis. No renal or ureteral stones are seen.  No free fluid is identified. The small bowel is unremarkable in appearance. The stomach is within normal limits. No acute vascular abnormalities are seen.  The appendix is normal in caliber, without evidence for appendicitis. There is diffuse fatty infiltration involving much of the wall of the colon, with sparing of the sigmoid colon, likely reflecting chronic sequelae of inflammation. The colon is otherwise unremarkable.  The bladder is mildly distended and grossly unremarkable. The uterus is within normal limits. The patient's intrauterine device is noted in expected position at the uterine fundus. No suspicious adnexal masses are seen. The ovaries are relatively symmetric. No inguinal lymphadenopathy is seen.  No acute osseous abnormalities are identified.  IMPRESSION: 1. Vague nonspecific bilateral perinephric stranding noted. This may be within normal limits, though pyelonephritis cannot be excluded, given the patient's fever and renal failure. 2. Splenomegaly noted. 3. Diffuse fatty infiltration involving much of the wall of the colon, with sparing of the sigmoid colon, likely reflecting chronic sequelae of prior inflammation.   Electronically Signed   By: Roanna Raider M.D.   On: 02/06/2015 23:50   US Abdomen Complete  02/07/2015   CLINICAL DATA:  Diffuse left upper quadrant abdominal, nausea, vomiting and diarrhea. Fever. Elevated creatinine. Initial encounter.  EXAM: ULTRASOUND ABDOMEN COMPLETE  COMPARISON:  CT of the abdomen and pelvis performed 02/06/2015  FINDINGS: Gallbladder: No gallstones or wall thickening visualized. No sonographic Murphy  sign noted.  Common bile duct: Diameter: 0.7 cm; this is borderline prominent, though still thought to remain within normal limits, given the lack of significant dilatation on recent CT.  Liver: No focal lesion identified. Within normal limits in parenchymal echogenicity, though somewhat coarsened in echotexture.  IVC: No abnormality visualized.  Pancreas: Visualized portion unremarkable.  Spleen:  Known splenomegaly is not well characterized on ultrasound.  Right Kidney: Length: 10.9 cm. Echogenicity within normal limits. No mass or hydronephrosis visualized. Evaluation is suboptimal due to limitations in positioning.  Left Kidney: Length: 13.5 cm. Echogenicity within normal limits. No mass or hydronephrosis visualized. Evaluation is suboptimal due to limitations in positioning.  Abdominal aorta: No aneurysm visualized. Not well characterized distally due to overlying bowel gas.  Other findings: None.  IMPRESSION: 1. Gallbladder unremarkable in appearance. 2. Borderline prominence of the common bile duct, measuring up to 0.7 cm. This thought to still remain within normal limits, given the normal appearance on recent CT. 3. Known splenomegaly is not well characterized on ultrasound. 4. Kidneys difficult to fully assess due to limitations in  positioning.   Electronically Signed   By: Roanna Raider M.D.   On: 02/07/2015 02:26   Dg Chest Port 1 View  02/08/2015   CLINICAL DATA:  Sepsis.  EXAM: PORTABLE CHEST - 1 VIEW  COMPARISON:  02/06/2015.  FINDINGS: Support apparatus: LEFT IJ central line is unchanged with the tip in the brachiocephalic vein. Introduction of an enteric tube with the tip not visible. Monitoring leads project over the chest.  Cardiomediastinal Silhouette:  Unchanged.  Lungs: Mild basilar atelectasis.  No pneumothorax.  Effusions:  None.  Other:  None.  IMPRESSION: 1. Interval introduction of enteric tube. LEFT IJ central line unchanged. 2. Mild basilar atelectasis.   Electronically Signed   By:  Andreas Newport M.D.   On: 02/08/2015 07:24   Dg Chest Port 1 View  02/07/2015   CLINICAL DATA:  Initial evaluation for fever, nausea, vomiting.  EXAM: PORTABLE CHEST - 1 VIEW  COMPARISON:  Prior radiograph from earlier the same day.  FINDINGS: There has been interval placement of a left IJ approach central venous catheter with tip overlying the distal left brachiocephalic vein. Transverse heart size at the upper limits of normal. Mediastinal silhouette within normal limits.  Lungs are normally inflated. No focal infiltrate, pulmonary edema, or pleural effusion. No pneumothorax.  No acute osseus abnormality. No free air underneath the hemidiaphragms on this semi upright film.  IMPRESSION: 1. Tip of left IJ approach central venous catheter overlying the distal left brachiocephalic vein. 2. No other active cardiopulmonary disease.   Electronically Signed   By: Rise Mu M.D.   On: 02/07/2015 00:07   Dg Chest Port 1 View  02/06/2015   CLINICAL DATA:  Acute onset of generalized abdominal pain and vomiting. Dehydration. Initial encounter.  EXAM: PORTABLE CHEST - 1 VIEW  COMPARISON:  None.  FINDINGS: The lungs are hypoexpanded. Mild left basilar atelectasis is noted. There is no evidence of pleural effusion or pneumothorax.  The cardiomediastinal silhouette is borderline normal in size. No acute osseous abnormalities are seen.  IMPRESSION: Lungs hypoexpanded, with mild left basilar atelectasis.   Electronically Signed   By: Roanna Raider M.D.   On: 02/06/2015 19:57    Microbiology: Recent Results (from the past 240 hour(s))  Blood Culture (routine x 2)     Status: None (Preliminary result)   Collection Time: 02/06/15  7:50 PM  Result Value Ref Range Status   Specimen Description BLOOD ARM RIGHT  Final   Special Requests BOTTLES DRAWN AEROBIC ONLY 7CC  Final   Culture   Final           BLOOD CULTURE RECEIVED NO GROWTH TO DATE CULTURE WILL BE HELD FOR 5 DAYS BEFORE ISSUING A FINAL NEGATIVE  REPORT Performed at [facility]    Report Status PENDING  Incomplete  Blood Culture (routine x 2)     Status: None (Preliminary result)   Collection Time: 02/06/15  7:58 PM  Result Value Ref Range Status   Specimen Description BLOOD RIGHT FOREARM  Final   Special Requests BOTTLES DRAWN AEROBIC ONLY 2CC  Final   Culture   Final           BLOOD CULTURE RECEIVED NO GROWTH TO DATE CULTURE WILL BE HELD FOR 5 DAYS BEFORE ISSUING A FINAL NEGATIVE REPORT Performed at [facility]    Report Status PENDING  Incomplete  Urine culture     Status: None   Collection Time: 02/06/15  8:21 PM  Result Value Ref Range  Status   Specimen Description URINE, CATHETERIZED  Final   Special Requests Normal  Final   Colony Count NO GROWTH Performed at [facility]   Final   Culture NO GROWTH Performed at [facility]   Final   Report Status 02/07/2015 FINAL  Final  MRSA PCR Screening     Status: Abnormal   Collection Time: 02/07/15 12:28 AM  Result Value Ref Range Status   MRSA by PCR POSITIVE (A) NEGATIVE Final    Comment:        The GeneXpert MRSA Assay (FDA approved for NASAL specimens only), is one component of a comprehensive MRSA colonization surveillance program. It is not intended to diagnose MRSA infection nor to guide or monitor treatment for MRSA infections. RESULT CALLED TO, READ BACK BY AND VERIFIED WITH: HANCOCK,M RN [id number] 02/07/15 MITCHELL,L   Clostridium Difficile by PCR     Status: None   Collection Time: 02/08/15  2:54 AM  Result Value Ref Range Status   C difficile by pcr NEGATIVE NEGATIVE Final     Labs: Basic Metabolic Panel:  Recent Labs Lab 02/07/15 1500 02/07/15 2250 02/08/15 0300 02/09/15 0522 02/10/15 0443  NA 145 145 144 146* 144  K 4.0 3.3* 3.3* 3.6 3.4*  CL 115* 116* 112 113* 109  CO2 20 22 22 27 30   GLUCOSE 164* 157* 157* 101* 83  BUN 23 24* 27* 24* 15  CREATININE 2.00* 1.70* 1.66* 1.20* 1.05  CALCIUM 7.1* 7.2* 7.0*  7.4* 7.7*  MG  --   --   --  1.9  --   PHOS  --   --   --  2.6  --    Liver Function Tests:  Recent Labs Lab 02/06/15 1813 02/07/15 0220 02/08/15 0300 02/10/15 0443  AST 74* 69* 78* 64*  ALT 27 22 29  40*  ALKPHOS 79 66 57 50  BILITOT 0.9 0.8 0.5 0.4  PROT 7.3 5.2* 5.8* 5.2*  ALBUMIN 3.4* 2.4* 2.4* 2.3*    Recent Labs Lab 02/06/15 2146 02/07/15 0220  LIPASE 21  --   AMYLASE  --  49   No results for input(s): AMMONIA in the last 168 hours. CBC:  Recent Labs Lab 02/06/15 1813 02/07/15 0219 02/07/15 0500 02/08/15 0300 02/10/15 0443  WBC 20.9* 19.1* 26.1* 15.2* 4.1  NEUTROABS  --  16.7*  --  13.3*  --   HGB 13.4 10.0* 10.8* 10.3* 9.7*  HCT 40.4 30.2* 33.2* 31.1* 30.4*  MCV 83.5 83.0 83.4 83.4 84.9  PLT 191 PLATELET CLUMPS NOTED ON SMEAR, UNABLE TO ESTIMATE 121* 96* 82*   Cardiac Enzymes:  Recent Labs Lab 02/07/15 0218 02/07/15 0500 02/07/15 1055  TROPONINI 0.08* 0.06* 0.04*   BNP: BNP (last 3 results) No results for input(s): BNP in the last 8760 hours.  ProBNP (last 3 results) No results for input(s): PROBNP in the last 8760 hours.  CBG:  Recent Labs Lab 02/07/15 0037 02/07/15 0850  GLUCAP 86 158*     Signed:  Deniel Mcquiston K  [facility] 02/10/2015, 3:03 PM

## 2015-02-11 NOTE — Care Management Note (Signed)
    Page 1 of 1   02/11/2015     9:27:29 AM CARE MANAGEMENT NOTE 02/11/2015  Patient:  Lisa Lindsey,Lisa Lindsey   Account Number:  [id number]  Date Initiated:  02/07/2015  Documentation initiated by:  [facility]Saleem Coccia  Subjective/Objective Assessment:   Admitted with sepsis     Action/Plan:   Anticipated DC Date:  02/12/2015   Anticipated DC Plan:  HOME W HOME HEALTH SERVICES      DC Planning Services  CM consult      Choice offered to / List presented to:             Status of service:  Completed, signed off Medicare Important Message given?   (If response is "NO", the following Medicare IM given date fields will be blank) Date Medicare IM given:   Medicare IM given by:   Date Additional Medicare IM given:   Additional Medicare IM given by:    Discharge Disposition:  HOME/SELF CARE  Per UR Regulation:  Reviewed for med. necessity/level of care/duration of stay  If discussed at Long Length of Stay Meetings, dates discussed:    Comments:  02/10/15 13:30 Cm received notification from RN [facility] (and has been active with clinic).  No other CM needs were communicated.  Freddy Jakschsarah Verda Mehta, BSN, CM [phone].  Contact:  Daly,Tom Significant other [phone]

## 2015-02-12 LAB — RESPIRATORY VIRUS PANEL
Adenovirus: NEGATIVE
INFLUENZA B 1: NEGATIVE
Influenza A: NEGATIVE
Metapneumovirus: NEGATIVE
Parainfluenza 1: NEGATIVE
Parainfluenza 2: NEGATIVE
Parainfluenza 3: NEGATIVE
RESPIRATORY SYNCYTIAL VIRUS A: NEGATIVE
RHINOVIRUS: NEGATIVE
Respiratory Syncytial Virus B: NEGATIVE

## 2015-02-13 LAB — CULTURE, BLOOD (ROUTINE X 2)
CULTURE: NO GROWTH
Culture: NO GROWTH

## 2015-02-14 ENCOUNTER — Ambulatory Visit: Payer: Medicaid Other | Attending: Internal Medicine | Admitting: Family Medicine

## 2015-02-14 VITALS — BP 111/76 | HR 78 | Temp 97.8°F | Resp 16 | Ht 67.0 in | Wt 288.0 lb

## 2015-02-14 DIAGNOSIS — N179 Acute kidney failure, unspecified: Secondary | ICD-10-CM | POA: Insufficient documentation

## 2015-02-14 DIAGNOSIS — R110 Nausea: Secondary | ICD-10-CM | POA: Insufficient documentation

## 2015-02-14 DIAGNOSIS — R197 Diarrhea, unspecified: Secondary | ICD-10-CM | POA: Insufficient documentation

## 2015-02-14 DIAGNOSIS — Z09 Encounter for follow-up examination after completed treatment for conditions other than malignant neoplasm: Secondary | ICD-10-CM | POA: Diagnosis not present

## 2015-02-14 LAB — STOOL CULTURE

## 2015-02-14 NOTE — Progress Notes (Signed)
Subjective:     Patient ID: Lisa Lindsey, female   DOB: 03/17/1981, 34 y.o.   MRN: [id number]  HPI: Patient presents for hospital follow-up from 3/1 to 3/5.  She was admitted with nausea, vomiting, and abdominal pain. She developed hypotension intractable with fluids and was admitted to ICU with a tentative diagnosis of sepsis. There was a surgical but was found not to be a surgical problem. The final blood cultures were negative. She was discharged on pro-biotics and antibiotics. With resolution of symptoms except for some occassional nausea and mild diarrhea with is resolving. She was also diagnoses with acute renal failure.    Review of Systems She denies any symptoms except as mentioned above and not having her energy back as yet.    Objective:   Physical Exam    She is alert and oriented, in no distress,  There is no respiration difficulty, her skin is warm and dry. Lungs are clear to auscultation. HS are regular without m,g,r. Abdomen is obese, soft with normoactive BS and mildperiumbilical tenderness.  Assessment:     Viral Gastroenterologist, resolved.     Plan:     She to return in 1 week for labs to assess resolution of acute renal failure  and return in 2 weeks for PCP visit.

## 2015-02-14 NOTE — Progress Notes (Signed)
Patient reports still feeling poorly  History of present illness:  Please review h and p from 3/1 for details. Briefly, pt presented with abd pain with n/v and found to be hypotensive refractory to aggressive IVF. Pt was admitted to the ICU for further work up.  Hospital Course:  1. Septic shock from possible intra-abdominal etiology 1. Critical care recs to complete empiric levaquin, stop date on 3/9 2. Shock resolved during this admission 3. Leukocytosis normalized 4. On further questioning, pt reports multiple family members have same n/v/d symptoms at home. Suspect likely infectious etiology. 5. Cdiff is neg 2. ARF 1. Cr improved with hydration 3. Suspected gastroenteritis with hypovolemia 1. Pt with residual mild nausea but has been steadily improving 2. Pt's diet was successfully advanced to soft, to continue to advance after discharge 3. Strict hand washing recommended to patient 4. DVT prophylaxis 1. Heparin subQ 2.

## 2015-02-14 NOTE — Patient Instructions (Signed)
Continue with instructions from hospital. Make lab appointment for one week and follow up with assisned primary provider in 2 week

## 2015-02-23 ENCOUNTER — Ambulatory Visit: Payer: Self-pay | Attending: Internal Medicine

## 2015-02-23 DIAGNOSIS — Z09 Encounter for follow-up examination after completed treatment for conditions other than malignant neoplasm: Secondary | ICD-10-CM

## 2015-02-23 LAB — RENAL FUNCTION PANEL
ALBUMIN: 3.5 g/dL (ref 3.5–5.2)
BUN: 12 mg/dL (ref 6–23)
CO2: 28 meq/L (ref 19–32)
CREATININE: 0.75 mg/dL (ref 0.50–1.10)
Calcium: 8.9 mg/dL (ref 8.4–10.5)
Chloride: 104 mEq/L (ref 96–112)
Glucose, Bld: 144 mg/dL — ABNORMAL HIGH (ref 70–99)
Phosphorus: 4.3 mg/dL (ref 2.3–4.6)
Potassium: 4.5 mEq/L (ref 3.5–5.3)
Sodium: 140 mEq/L (ref 135–145)

## 2015-02-26 ENCOUNTER — Other Ambulatory Visit: Payer: Self-pay | Admitting: Family Medicine

## 2015-03-02 ENCOUNTER — Ambulatory Visit: Payer: Self-pay | Admitting: Family Medicine

## 2015-03-08 ENCOUNTER — Ambulatory Visit: Payer: Self-pay | Attending: Family Medicine | Admitting: Family Medicine

## 2015-03-08 ENCOUNTER — Encounter: Payer: Self-pay | Admitting: Family Medicine

## 2015-03-08 VITALS — BP 118/79 | HR 81 | Temp 98.1°F | Resp 18 | Ht 67.0 in | Wt 290.0 lb

## 2015-03-08 DIAGNOSIS — K644 Residual hemorrhoidal skin tags: Secondary | ICD-10-CM

## 2015-03-08 DIAGNOSIS — N179 Acute kidney failure, unspecified: Secondary | ICD-10-CM

## 2015-03-08 DIAGNOSIS — Z114 Encounter for screening for human immunodeficiency virus [HIV]: Secondary | ICD-10-CM | POA: Insufficient documentation

## 2015-03-08 DIAGNOSIS — Z3009 Encounter for other general counseling and advice on contraception: Secondary | ICD-10-CM

## 2015-03-08 DIAGNOSIS — R1010 Upper abdominal pain, unspecified: Secondary | ICD-10-CM

## 2015-03-08 DIAGNOSIS — D649 Anemia, unspecified: Secondary | ICD-10-CM | POA: Insufficient documentation

## 2015-03-08 DIAGNOSIS — M25551 Pain in right hip: Secondary | ICD-10-CM

## 2015-03-08 DIAGNOSIS — Z113 Encounter for screening for infections with a predominantly sexual mode of transmission: Secondary | ICD-10-CM | POA: Insufficient documentation

## 2015-03-08 DIAGNOSIS — K648 Other hemorrhoids: Secondary | ICD-10-CM

## 2015-03-08 DIAGNOSIS — Z309 Encounter for contraceptive management, unspecified: Secondary | ICD-10-CM

## 2015-03-08 LAB — CBC
HCT: 36.8 % (ref 36.0–46.0)
Hemoglobin: 12.1 g/dL (ref 12.0–15.0)
MCH: 26.7 pg (ref 26.0–34.0)
MCHC: 32.9 g/dL (ref 30.0–36.0)
MCV: 81.2 fL (ref 78.0–100.0)
MPV: 8.8 fL (ref 8.6–12.4)
PLATELETS: 245 10*3/uL (ref 150–400)
RBC: 4.53 MIL/uL (ref 3.87–5.11)
RDW: 16.4 % — ABNORMAL HIGH (ref 11.5–15.5)
WBC: 8 10*3/uL (ref 4.0–10.5)

## 2015-03-08 LAB — HEMOCCULT GUIAC POC 1CARD (OFFICE): Fecal Occult Blood, POC: NEGATIVE

## 2015-03-08 LAB — HIV ANTIBODY (ROUTINE TESTING W REFLEX): HIV: NONREACTIVE

## 2015-03-08 NOTE — Patient Instructions (Signed)
Ms. Pricilla Holmucker,  Thank you for coming in today. It was a pleasure meeting you. I look forward to being your primary doctor.  1. R hip pain: exam with hip pain with external rotation of hips. Plan for x-rays of hips. Tart cherry juice for inflammation.   2. Rectal bleeding: evidence of hemorrhoid, no thrombosis. Be sure to not strain as straining with aggravate hemorrhoids.   3. Mirena problems- give some more thought to removal if you like it removed I can do that and your pap smear at the same time.   4. Anemia: you became anemic during your hospitalization- checking CBC today.  5. Kidney injury: your kidney have normalized. Lab Results  Component Value Date   CREATININE 0.75 02/23/2015   CREATININE 1.05 02/10/2015   CREATININE 1.20* 02/09/2015    5. Health care maintenance: screening HIV today  You will be called with lab results.  F/u in 4-6 weeks for pap smear.   Dr. Armen PickupFunches

## 2015-03-08 NOTE — Progress Notes (Signed)
   Subjective:    Patient ID: Lisa Lindsey, female    DOB: 04/19/1981, 34 y.o.   MRN: [id number]  HPI  1. Abdominal pain: resolved. Patient had finished flagyl. Intermittent bright red blood per rectum, small amount associated with pain after BMs. No fever or diarrhea.   2. R hip pain: anterior thighs. Started 2 months in both hips. 10 days R hip started hurting more. Sharp pains. Worse with external rotation of leg. No skin rash over thighs. Did have rash on chest tat resolved during hospitalization. Aleve helps and knocks out pain. No hip trauma. On feet constantly at work. Weight has been stable. Goes for walks for exercise with the kids. L hips is doing well. Has not had hip x-ray. Was going to the family doctor in [place].   3. Mirena problem: making her feel moody and irritable. Placed in summer of 2015. Considering having it removed.   soc Hx: non smoker  Review of Systems  Constitutional: Negative for fatigue.  Musculoskeletal: Positive for myalgias and arthralgias.       Objective:   Physical Exam BP 118/79 mmHg  Pulse 81  Temp(Src) 98.1 F (36.7 C) (Oral)  Resp 18  Ht 5\' 7"  (1.702 m)  Wt 290 lb (131.543 kg)  BMI 45.41 kg/m2  SpO2 100% General appearance: alert, cooperative, mild distress and moderately obese Abdomen: soft, non-tender; bowel sounds normal; no masses,  no organomegaly Rectal: normal tone. Small external hemorrhoid. FOBT negative.  Extremities: Full ROM of hips, negative FABER and log roll b/l no skin rash.        Assessment & Plan:

## 2015-03-08 NOTE — Progress Notes (Signed)
F/U ER Complain leg pain Blood in stool  Hx of colon infection

## 2015-03-08 NOTE — Assessment & Plan Note (Signed)
Resolved

## 2015-03-11 DIAGNOSIS — K644 Residual hemorrhoidal skin tags: Secondary | ICD-10-CM | POA: Insufficient documentation

## 2015-03-11 DIAGNOSIS — Z3009 Encounter for other general counseling and advice on contraception: Secondary | ICD-10-CM | POA: Insufficient documentation

## 2015-03-11 NOTE — Assessment & Plan Note (Signed)
R hip pain: exam with hip pain with external rotation of hips. Plan for x-rays of hips. Tart cherry juice for inflammation.

## 2015-03-11 NOTE — Assessment & Plan Note (Signed)
Rectal bleeding: evidence of hemorrhoid, no thrombosis. Be sure to not strain as straining with aggravate hemorrhoids.

## 2015-03-11 NOTE — Assessment & Plan Note (Signed)
Resolved

## 2015-03-11 NOTE — Assessment & Plan Note (Signed)
Mirena problems- give some more thought to removal if you like it removed I can do that and your pap smear at the same time.

## 2015-03-11 NOTE — Assessment & Plan Note (Signed)
A: improved, finished flagyl, suspect colitis  P: monitor q visit

## 2015-03-11 NOTE — Assessment & Plan Note (Signed)
Screening HIV negative  

## 2015-03-16 ENCOUNTER — Telehealth: Payer: Self-pay | Admitting: Physician Assistant

## 2015-03-16 NOTE — Telephone Encounter (Signed)
Spoke with pt in regards to her recent labs. Questions answered. Verbalized understanding.

## 2015-04-06 ENCOUNTER — Ambulatory Visit: Payer: Medicaid Other | Attending: Family Medicine | Admitting: Family Medicine

## 2015-04-06 ENCOUNTER — Encounter: Payer: Self-pay | Admitting: Family Medicine

## 2015-04-06 VITALS — BP 102/69 | HR 72 | Temp 98.6°F | Resp 18 | Ht 67.0 in | Wt 295.0 lb

## 2015-04-06 DIAGNOSIS — Z6841 Body Mass Index (BMI) 40.0 and over, adult: Secondary | ICD-10-CM | POA: Diagnosis not present

## 2015-04-06 DIAGNOSIS — R454 Irritability and anger: Secondary | ICD-10-CM

## 2015-04-06 DIAGNOSIS — Z124 Encounter for screening for malignant neoplasm of cervix: Secondary | ICD-10-CM | POA: Diagnosis not present

## 2015-04-06 DIAGNOSIS — F419 Anxiety disorder, unspecified: Secondary | ICD-10-CM | POA: Insufficient documentation

## 2015-04-06 LAB — CERVICOVAGINAL ANCILLARY ONLY: Wet Prep (BD Affirm): NEGATIVE

## 2015-04-06 NOTE — Progress Notes (Signed)
ASSESSMENT: Pt currently experiencing increasing symptoms of anxiety, including panic attacks. Pt needs to F/U with PCP as directed. Pt would benefit from going to a psychiatrist for further assessment and treatment, along with [facility]. She would also benefit from psychoeducation about coping with symptoms of anxiety and panic attacks.  Stage of Change: action  PT AGREES WITH FOLLOWING TREATMENT PLAN: 1. F/U with behavioral health consultant in two weeks. 2. Psychiatric Medications: none 3. Behavioral recommendation(s):   -[facility] and BH treatment -Or, Consider going to [facility] for further assessment and BH treatment -Consider reading educational materials given about coping with symptoms of anxiety -Consider practicing daily CALM breathing exercises daily to cope with symptoms of anxiety  SUBJECTIVE: Pt. referred by Dr. Armen PickupFunches for stress and anxiety, including panic attacks Pt. here for REFERRAL  regarding coping with symptoms of stress, anxiety, and panic attacks Pt. reports the following symptoms/concerns: Pt states that she has always been a "worrier", but that the anxiety involved in the worry has continued to increase, starting in about October 2015. She had at least two panic attacks since  February 2016. She states that no event has preceded the increased anxiety or panic attacks, and that she does not think that her IUD has caused the feelings of anxiety. She currently lives with her boyfriend, her 6yo son, and his 207 and 9yo sons. She states that she is concerned that her excessive worry is causing relationship problems with her boyfriend.  Duration of problem: about five months Severity: moderate  OBJECTIVE: Orientation & Cognition: Oriented x3. Thought processes normal and appropriate to situation. Mood: appropriate Affect: appropriate Appearance: appropriate Risk of harm to self or others: no risk of  harm to self or others Substance use: none Psychiatric medication use: Unchanged from prior contact. Assessments administered: risk assessment/ PSQ9- 4/ GAD7-16  Diagnosis: Anxiety disorder, unspecified CPT Code: F41.9 -------------------------------------------- Other(s) present in the room: none   Time spent with patient in exam room: 25 minutes

## 2015-04-06 NOTE — Patient Instructions (Signed)
Ms. Pricilla Holmucker,  Thank you for coming in today.  1. Pap done today. You will be called with results.  2. Feeling anxious and irritability. I do not believe this is due to your IUD Referral to counselor/social worker Checking vit D level    F/u in 3 months for anxiety, sooner if needed  Dr. Armen PickupFunches

## 2015-04-06 NOTE — Progress Notes (Signed)
Annual pap smear 

## 2015-04-07 NOTE — Assessment & Plan Note (Signed)
Pap done today  Normal pelvic exam  

## 2015-04-07 NOTE — Assessment & Plan Note (Signed)
A: 8 months of irritability with anxiety symptoms. I do not suspect IUD side effect, I suspect an anxiety disorder related to stress P: Referral to CSW in house Patient to f/u for vit D check and to discuss medication options for anxiety

## 2015-04-07 NOTE — Progress Notes (Signed)
   Subjective:    Patient ID: Lisa BertinAimee Lindsey, female    DOB: 08/03/1981, 34 y.o.   MRN: [id number] CC: pap,  ? Remove IUD  HPI  1. Pap: no hx of abnormal pap smears. Has one long term sex partner. Has 177 yo son.   2. ? Remove IUD: feeling irritable and anxious since 07/2014. Mood is affecting work and personal relationship. ? If IUD is causing irritability. This is her second IUD. She did not feel the same with first. She had a panic attack at work in 01/2015.   Soc Hx: non smoker  Review of Systems  Genitourinary: Negative.   Psychiatric/Behavioral: Positive for dysphoric mood. Negative for suicidal ideas and self-injury. The patient is nervous/anxious. The patient is not hyperactive.        Objective:   Physical Exam BP 102/69 mmHg  Pulse 72  Temp(Src) 98.6 F (37 C) (Oral)  Resp 18  Ht 5\' 7"  (1.702 m)  Wt 295 lb (133.811 kg)  BMI 46.19 kg/m2  SpO2 100% General appearance: alert, cooperative, no distress and morbidly obese Abdomen: soft, non-tender; bowel sounds normal; no masses,  no organomegaly Pelvic: cervix normal in appearance, external genitalia normal, no adnexal masses or tenderness, no cervical motion tenderness, rectovaginal septum normal, uterus normal size, shape, and consistency and vagina normal without discharge       Assessment & Plan:

## 2015-04-09 LAB — CERVICOVAGINAL ANCILLARY ONLY
CHLAMYDIA, DNA PROBE: NEGATIVE
Neisseria Gonorrhea: NEGATIVE

## 2015-04-10 LAB — CYTOLOGY - PAP

## 2015-04-11 ENCOUNTER — Telehealth: Payer: Self-pay | Admitting: *Deleted

## 2015-04-11 NOTE — Telephone Encounter (Signed)
Pt aware of results 

## 2015-04-11 NOTE — Telephone Encounter (Signed)
-----   Message from Dessa PhiJosalyn Funches, MD sent at 04/09/2015 12:24 PM EDT ----- Negative Gc/chlam

## 2015-04-11 NOTE — Telephone Encounter (Signed)
-----   Message from Dessa PhiJosalyn Funches, MD sent at 04/07/2015 12:25 PM EDT ----- Negative wet prep

## 2015-04-25 ENCOUNTER — Telehealth: Payer: Self-pay | Admitting: *Deleted

## 2015-04-25 NOTE — Telephone Encounter (Signed)
-----   Message from Dessa PhiJosalyn Funches, MD sent at 04/12/2015  9:07 AM EDT ----- Negative pap, repeat in 5 years

## 2015-04-25 NOTE — Telephone Encounter (Signed)
LVM to return call.

## 2015-06-14 ENCOUNTER — Encounter: Payer: Self-pay | Admitting: Family Medicine

## 2015-06-14 ENCOUNTER — Ambulatory Visit: Payer: Medicaid Other | Attending: Family Medicine | Admitting: Family Medicine

## 2015-06-14 VITALS — BP 115/78 | HR 74 | Temp 98.3°F | Resp 16 | Ht 67.0 in | Wt 300.0 lb

## 2015-06-14 DIAGNOSIS — F411 Generalized anxiety disorder: Secondary | ICD-10-CM | POA: Insufficient documentation

## 2015-06-14 DIAGNOSIS — E559 Vitamin D deficiency, unspecified: Secondary | ICD-10-CM

## 2015-06-14 LAB — T4, FREE: Free T4: 0.84 ng/dL (ref 0.80–1.80)

## 2015-06-14 LAB — T3, FREE: T3 FREE: 2.7 pg/mL (ref 2.3–4.2)

## 2015-06-14 LAB — TSH: TSH: 4.081 u[IU]/mL (ref 0.350–4.500)

## 2015-06-14 MED ORDER — HYDROXYZINE HCL 25 MG PO TABS: 25.0000 mg | ORAL_TABLET | Freq: Three times a day (TID) | ORAL | Status: DC | PRN

## 2015-06-14 MED ORDER — ESCITALOPRAM OXALATE 5 MG PO TABS: 5.0000 mg | ORAL_TABLET | Freq: Every day | ORAL | Status: DC

## 2015-06-14 NOTE — Progress Notes (Signed)
   Subjective:    Patient ID: Lisa Lindsey, female    DOB: 07/09/1981, 34 y.o.   MRN: [id number] CC; generalized anxiety   HPI 34 yo F with no significant medical problems   1. Anxiety: feeling irritable and anxious since 07/2014. Mood is affecting work and personal relationship. She had a panic attack at work in 01/2015. Her panic attacks are associated with SOB. She has no personal or family history of mood disorder. She is now separated from her husband. She is caring for 3 children.   Soc Hx: non smoker  Review of Systems GAD-7: score of 11. 1-5 and 2-all other     Objective:   Physical Exam BP 115/78 mmHg  Pulse 74  Temp(Src) 98.3 F (36.8 C) (Oral)  Resp 16  Ht 5\' 7"  (1.702 m)  Wt 300 lb (136.079 kg)  BMI 46.98 kg/m2  SpO2 100% General appearance: alert, cooperative and no distress Lungs: normal WOB   [facility] 06/14/2015 06/14/2015 04/06/2015 03/08/2015 02/14/2015  Decreased Interest 0 0 0 0 0  Down, Depressed, Hopeless 1 1 0 0 0  PHQ - 2 Score 1 1 0 0 0  Altered sleeping - 0 - - -  Tired, decreased energy - 1 - - -  Change in appetite - 1 - - -  Feeling bad or failure about yourself  - 0 - - -  Trouble concentrating - 0 - - -  Moving slowly or fidgety/restless - 0 - - -  Suicidal thoughts - 0 - - -  PHQ-9 Score - 3 - - -         Assessment & Plan:

## 2015-06-14 NOTE — Patient Instructions (Signed)
Mrs. Pricilla Holmucker,  Thank you for coming in today  1. Stress related anxiety  Start lexapro 5 mg daily after breakfast Take atarax 25 mg three times daily as need for attacks  Checking Vit D and rechecking thyroid studies  F/u in 3 weeks for anxiety   Dr. Armen PickupFunches

## 2015-06-14 NOTE — Progress Notes (Signed)
F/U anxiety Stated feeling worst then last visit

## 2015-06-14 NOTE — Assessment & Plan Note (Signed)
Stress related anxiety  Start lexapro 5 mg daily after breakfast Take atarax 25 mg three times daily as need for attacks  Checking Vit D and rechecking thyroid studies

## 2015-06-15 DIAGNOSIS — E559 Vitamin D deficiency, unspecified: Secondary | ICD-10-CM | POA: Insufficient documentation

## 2015-06-15 LAB — VITAMIN D 25 HYDROXY (VIT D DEFICIENCY, FRACTURES): Vit D, 25-Hydroxy: 20 ng/mL — ABNORMAL LOW (ref 30–100)

## 2015-06-15 MED ORDER — VITAMIN D3 50 MCG (2000 UT) PO TABS: 2000.0000 [IU] | ORAL_TABLET | Freq: Every day | ORAL | Status: DC

## 2015-06-15 NOTE — Addendum Note (Signed)
Addended by: Dessa PhiFUNCHES, Jonel Weldon on: 06/15/2015 04:56 PM   Modules accepted: Orders

## 2015-06-15 NOTE — Assessment & Plan Note (Signed)
Vit D insufficiency.  2 K U of D3 weekly recommended and ordered

## 2015-06-20 ENCOUNTER — Telehealth: Payer: Self-pay | Admitting: *Deleted

## 2015-06-20 NOTE — Telephone Encounter (Signed)
-----   Message from Dessa PhiJosalyn Funches, MD sent at 06/15/2015  4:54 PM EDT ----- Normal thyroid studies Vit D insuff, take vit D supplement

## 2015-06-20 NOTE — Telephone Encounter (Signed)
LVM to return call.

## 2015-07-06 ENCOUNTER — Ambulatory Visit: Payer: Self-pay | Admitting: Family Medicine

## 2015-07-13 ENCOUNTER — Ambulatory Visit: Payer: Medicaid Other | Attending: Family Medicine | Admitting: Family Medicine

## 2015-07-13 ENCOUNTER — Encounter: Payer: Self-pay | Admitting: Family Medicine

## 2015-07-13 ENCOUNTER — Ambulatory Visit: Payer: Self-pay

## 2015-07-13 VITALS — BP 114/77 | HR 82 | Temp 98.0°F | Resp 16 | Ht 67.0 in | Wt 300.0 lb

## 2015-07-13 DIAGNOSIS — F419 Anxiety disorder, unspecified: Secondary | ICD-10-CM | POA: Diagnosis not present

## 2015-07-13 DIAGNOSIS — F411 Generalized anxiety disorder: Secondary | ICD-10-CM | POA: Diagnosis not present

## 2015-07-13 MED ORDER — ESCITALOPRAM OXALATE 10 MG PO TABS: 10.0000 mg | ORAL_TABLET | Freq: Every day | ORAL | Status: DC

## 2015-07-13 NOTE — Assessment & Plan Note (Signed)
Anxiety: Score has improved slightly Increase lexapro to 10 mg daily Stop atarax  F/u with Asher Muir with counseling sessions at your earliest convenience F/u with me in 6 weeks for anxiety

## 2015-07-13 NOTE — Progress Notes (Signed)
F/U anxiety   

## 2015-07-13 NOTE — Progress Notes (Signed)
   Subjective:    Patient ID: Lisa Lindsey, female    DOB: 1981-11-23, 34 y.o.   MRN: [id number] CC: f/u anxiety  HPI  1 f/u anxiety: doing well. Tolerating lexapro. Did not tolerate atarax made her feel "funny".   2. Recent sinusitis: went to urgent care. Taking prednisone and z-pac. Feeling better. No fever. Ear pain has resolved. Multiple sick contacts.   History  Substance Use Topics  . Smoking status: Never Smoker   . Smokeless tobacco: Not on file  . Alcohol Use: No   Review of Systems  Constitutional: Negative for fever and chills.  Eyes: Negative for visual disturbance.  Respiratory: Positive for cough. Negative for shortness of breath.   Cardiovascular: Negative for chest pain.  Musculoskeletal: Negative for arthralgias.  Skin: Negative for rash.  Psychiatric/Behavioral: Negative for suicidal ideas and dysphoric mood. The patient is nervous/anxious.   GAD-7: score of 8. 0-7;  1-2-5; 2-1 and 6.     Objective:   Physical Exam  Constitutional: She is oriented to person, place, and time. She appears well-developed and well-nourished. No distress.  HENT:  Head: Normocephalic and atraumatic.  Right Ear: Tympanic membrane and external ear normal.  Left Ear: Tympanic membrane and external ear normal.  Mouth/Throat: Oropharynx is clear and moist.  Cardiovascular: Normal rate, regular rhythm, normal heart sounds and intact distal pulses.   Pulmonary/Chest: Effort normal.  Neurological: She is alert and oriented to person, place, and time.  Skin: Skin is warm and dry.  Psychiatric: She has a normal mood and affect.   Wt Readings from Last 3 Encounters:  07/13/15 300 lb (136.079 kg)  06/14/15 300 lb (136.079 kg)  04/06/15 295 lb (133.811 kg)    Depression screen [facility] 2/9 07/13/2015 06/14/2015 06/14/2015 04/06/2015 03/08/2015  Decreased Interest 0 0 0 0 0  Down, Depressed, Hopeless 0 0  PHQ - 2 Score 0 0  Altered sleeping 0 - 0 - -  Tired, decreased energy 1 - 1 - -    Change in appetite 0 - 1 - -  Feeling bad or failure about yourself  0 - 0 - -  Trouble concentrating 1 - 0 - -  Moving slowly or fidgety/restless 0 - 0 - -  Suicidal thoughts 0 - 0 - -  PHQ-9 Score 3 - 3 - -         Assessment & Plan:

## 2015-07-13 NOTE — Patient Instructions (Addendum)
Ms. Skipper,  Thank you for coming in today  1. Anxiety: Score has improved slightly Increase lexapro to 10 mg daily Stop atarax  F/u with Asher Muir with counseling sessions at your earliest convenience F/u with me in 6 weeks for anxiety    Dr. Armen Pickup

## 2015-07-16 ENCOUNTER — Other Ambulatory Visit: Payer: Medicaid Other

## 2015-08-24 ENCOUNTER — Ambulatory Visit: Payer: Medicaid Other | Admitting: Family Medicine

## 2015-09-17 ENCOUNTER — Encounter: Payer: Self-pay | Admitting: Family Medicine

## 2015-09-17 ENCOUNTER — Ambulatory Visit: Payer: Medicaid Other | Attending: Family Medicine | Admitting: Family Medicine

## 2015-09-17 VITALS — BP 109/74 | HR 76 | Temp 98.5°F | Resp 16 | Ht 67.0 in | Wt 308.0 lb

## 2015-09-17 DIAGNOSIS — Z6841 Body Mass Index (BMI) 40.0 and over, adult: Secondary | ICD-10-CM | POA: Insufficient documentation

## 2015-09-17 DIAGNOSIS — R202 Paresthesia of skin: Secondary | ICD-10-CM | POA: Insufficient documentation

## 2015-09-17 DIAGNOSIS — R52 Pain, unspecified: Secondary | ICD-10-CM | POA: Insufficient documentation

## 2015-09-17 DIAGNOSIS — F411 Generalized anxiety disorder: Secondary | ICD-10-CM | POA: Diagnosis not present

## 2015-09-17 DIAGNOSIS — R200 Anesthesia of skin: Secondary | ICD-10-CM | POA: Insufficient documentation

## 2015-09-17 DIAGNOSIS — Z79899 Other long term (current) drug therapy: Secondary | ICD-10-CM | POA: Insufficient documentation

## 2015-09-17 DIAGNOSIS — Z833 Family history of diabetes mellitus: Secondary | ICD-10-CM | POA: Insufficient documentation

## 2015-09-17 LAB — RHEUMATOID FACTOR: Rhuematoid fact SerPl-aCnc: <10 IU/mL (ref <=14)

## 2015-09-17 LAB — CBC WITH DIFFERENTIAL/PLATELET
BASOS ABS: 0 10*3/uL (ref 0.0–0.1)
BASOS PCT: 0 % (ref 0–1)
EOS ABS: 0.3 10*3/uL (ref 0.0–0.7)
Eosinophils Relative: 3 % (ref 0–5)
HCT: 39.1 % (ref 36.0–46.0)
HEMOGLOBIN: 13.2 g/dL (ref 12.0–15.0)
LYMPHS ABS: 1.8 10*3/uL (ref 0.7–4.0)
Lymphocytes Relative: 19 % (ref 12–46)
MCH: 28.8 pg (ref 26.0–34.0)
MCHC: 33.8 g/dL (ref 30.0–36.0)
MCV: 85.2 fL (ref 78.0–100.0)
MONOS PCT: 8 % (ref 3–12)
MPV: 9.2 fL (ref 8.6–12.4)
Monocytes Absolute: 0.7 10*3/uL (ref 0.1–1.0)
NEUTROS ABS: 6.5 10*3/uL (ref 1.7–7.7)
NEUTROS PCT: 70 % (ref 43–77)
PLATELETS: 309 10*3/uL (ref 150–400)
RBC: 4.59 MIL/uL (ref 3.87–5.11)
RDW: 14.2 % (ref 11.5–15.5)
WBC: 9.3 10*3/uL (ref 4.0–10.5)

## 2015-09-17 LAB — COMPLETE METABOLIC PANEL WITH GFR
ALBUMIN: 3.9 g/dL (ref 3.6–5.1)
ALK PHOS: 73 U/L (ref 33–115)
ALT: 14 U/L (ref 6–29)
AST: 15 U/L (ref 10–30)
BILIRUBIN TOTAL: 0.4 mg/dL (ref 0.2–1.2)
BUN: 14 mg/dL (ref 7–25)
CO2: 31 mmol/L (ref 20–31)
CREATININE: 0.75 mg/dL (ref 0.50–1.10)
Calcium: 9.6 mg/dL (ref 8.6–10.2)
Chloride: 100 mmol/L (ref 98–110)
GFR, Est African American: >89 mL/min (ref >=60)
GFR, Est Non African American: >89 mL/min (ref >=60)
GLUCOSE: 117 mg/dL — AB (ref 65–99)
Potassium: 5 mmol/L (ref 3.5–5.3)
SODIUM: 139 mmol/L (ref 135–146)
TOTAL PROTEIN: 6.8 g/dL (ref 6.1–8.1)

## 2015-09-17 LAB — URIC ACID: Uric Acid, Serum: 5 mg/dL (ref 2.4–7.0)

## 2015-09-17 LAB — POCT GLYCOSYLATED HEMOGLOBIN (HGB A1C): HEMOGLOBIN A1C: 5.3

## 2015-09-17 MED ORDER — ESCITALOPRAM OXALATE 10 MG PO TABS: 10.0000 mg | ORAL_TABLET | Freq: Every day | ORAL | Status: DC

## 2015-09-17 NOTE — Progress Notes (Signed)
Patient ID: Lisa Lindsey, female   DOB: 11/19/1981, 34 y.o.   MRN: [id number]   Subjective:  Patient ID: Lisa Lindsey, female    DOB: [birth year]  Age: 34 y.o. MRN: [id number]  CC: Follow-up and Anxiety   HPI Emanuela Bodin presents for   1. Anxiety: taking lexapro. Anxiety is stable. Sometimes worsens. No recent stressors.   2. Body aches: joint aches from shoulders down. Swelling and crepitus in knees. Numbness and tingling and hands at times. Symptoms x one year or more. No trauma. Patient morbidly obese all of her life. Mom with diabetes. No rash, fever, sores, eye changes.   Outpatient Prescriptions Prior to Visit  Medication Sig Dispense Refill  . escitalopram (LEXAPRO) 10 MG tablet Take 1 tablet (10 mg total) by mouth daily. 30 tablet 2  . levonorgestrel (MIRENA) 20 MCG/24HR IUD 1 each by Intrauterine route once. June 2015    . azithromycin (ZITHROMAX) 250 MG tablet Take by mouth daily.    . Cholecalciferol (VITAMIN D3) 2000 UNITS TABS Take 2,000 Units by mouth daily. (Patient not taking: Reported on 09/17/2015) 30 tablet 11  . predniSONE (DELTASONE) 20 MG tablet Take 20 mg by mouth daily with breakfast.     No facility-administered medications prior to visit.    ROS Review of Systems  Constitutional: Negative for fever and chills.  Eyes: Negative for visual disturbance.  Respiratory: Negative for shortness of breath.   Cardiovascular: Negative for chest pain.  Gastrointestinal: Negative for abdominal pain and blood in stool.  Musculoskeletal: Positive for myalgias, back pain, joint swelling and arthralgias.  Skin: Negative for rash.  Allergic/Immunologic: Negative for immunocompromised state.  Neurological: Positive for numbness.  Hematological: Negative for adenopathy. Does not bruise/bleed easily.  Psychiatric/Behavioral: Negative for suicidal ideas and dysphoric mood. The patient is nervous/anxious.     Objective:  BP 109/74 mmHg  Pulse 76  Temp(Src) 98.5 F (36.9 C)  (Oral)  Resp 16  Ht  (1.702 m)  Wt 308 lb (139.708 kg)  BMI 48.23 kg/m2  SpO2 99%  BP/Weight 09/17/2015 07/13/2015 06/14/2015  Systolic BP 109 114 115  Diastolic BP 74 77 78  Wt. (Lbs) 308 300 300  BMI 48.23 46.98 46.98    Physical Exam  Constitutional: She is oriented to person, place, and time. She appears well-developed and well-nourished. No distress.  HENT:  Head: Normocephalic and atraumatic.  Cardiovascular: Normal rate, regular rhythm, normal heart sounds and intact distal pulses.   Pulmonary/Chest: Effort normal and breath sounds normal.  Musculoskeletal: She exhibits no edema.  Neurological: She is alert and oriented to person, place, and time.  Skin: Skin is warm and dry. No rash noted.  Psychiatric: She has a normal mood and affect.  GAD-7: score of 14. 1-5,7. 2-2,3,4. 3-1,6.    Assessment & Plan:   Problem List Items Addressed This Visit    Body aches - Primary    A; suspect myalgias and arthralgias related to obesity with joint degeneration and nerve compressions P Labs to r/o other endocrine disorders, gout, autoimmune arthritis We discussed that focus weight loss is most likely indicated with may include changing or reducing SSRI       Relevant Orders   HgB A1c (Completed)   POCT SEDIMENTATION RATE   TSH   Rheumatoid factor   CBC with Differential   COMPLETE METABOLIC PANEL WITH GFR   Uric acid   Vitamin B12   Vitamin D, 25-hydroxy   Generalized anxiety disorder (Chronic)   Relevant Medications  escitalopram (LEXAPRO) 10 MG tablet      No orders of the defined types were placed in this encounter.    Follow-up: No Follow-up on file.   Dessa Phi MD

## 2015-09-17 NOTE — Progress Notes (Signed)
F/U Anxiety  C/C body ache and tingle and numbness  in hands  Pain scale # 5

## 2015-09-17 NOTE — Assessment & Plan Note (Signed)
A; suspect myalgias and arthralgias related to obesity with joint degeneration and nerve compressions P Labs to r/o other endocrine disorders, gout, autoimmune arthritis We discussed that focus weight loss is most likely indicated with may include changing or reducing SSRI

## 2015-09-17 NOTE — Patient Instructions (Addendum)
Chloeann was seen today for follow-up and anxiety.  Diagnoses and all orders for this visit:  Body aches -     HgB A1c -     POCT SEDIMENTATION RATE -     TSH -     Rheumatoid factor -     CBC with Differential -     COMPLETE METABOLIC PANEL WITH GFR -     Uric acid -     Vitamin B12 -     Vitamin D, 25-hydroxy  Generalized anxiety disorder -     escitalopram (LEXAPRO) 10 MG tablet; Take 1 tablet (10 mg total) by mouth daily.   Aime we are getting the above labs to rule out endocrine disorders and inflammatory arthritis.  You will be called with lab results and f/u plan based on labs Consider wearing wrist splints at night.   F/u in 2 months for aches and anxiety   Dr. Armen Pickup

## 2015-09-18 ENCOUNTER — Telehealth: Payer: Self-pay | Admitting: Family Medicine

## 2015-09-18 DIAGNOSIS — E559 Vitamin D deficiency, unspecified: Secondary | ICD-10-CM

## 2015-09-18 DIAGNOSIS — F411 Generalized anxiety disorder: Secondary | ICD-10-CM

## 2015-09-18 DIAGNOSIS — R52 Pain, unspecified: Secondary | ICD-10-CM

## 2015-09-18 LAB — VITAMIN D 25 HYDROXY (VIT D DEFICIENCY, FRACTURES): Vit D, 25-Hydroxy: 20 ng/mL — ABNORMAL LOW (ref 30–100)

## 2015-09-18 LAB — TSH: TSH: 2.576 u[IU]/mL (ref 0.350–4.500)

## 2015-09-18 LAB — VITAMIN B12: Vitamin B-12: 451 pg/mL (ref 211–911)

## 2015-09-18 MED ORDER — DULOXETINE HCL 30 MG PO CPEP: 30.0000 mg | ORAL_CAPSULE | Freq: Every day | ORAL | Status: DC

## 2015-09-18 MED ORDER — VITAMIN D (ERGOCALCIFEROL) 1.25 MG (50000 UNIT) PO CAPS: 50000.0000 [IU] | ORAL_CAPSULE | ORAL | Status: DC

## 2015-09-18 NOTE — Assessment & Plan Note (Signed)
Body aches in setting of obesity  Advise weight loss Patient directed to a ketogenic diet

## 2015-09-18 NOTE — Telephone Encounter (Signed)
Called patient Gave lab results Discussed plan  Vit D replacement Transition from lexapro to cymbalta Keto diet and exercise for weight loss

## 2015-12-11 MED FILL — DULoxetine HCL 30 MG CPEP: 30 | 30 days supply | Qty: 30 | Fill #2

## 2016-02-04 ENCOUNTER — Encounter: Payer: Self-pay | Admitting: Clinical

## 2016-02-04 ENCOUNTER — Ambulatory Visit: Payer: Medicaid Other | Attending: Family Medicine | Admitting: Family Medicine

## 2016-02-04 ENCOUNTER — Encounter: Payer: Self-pay | Admitting: Family Medicine

## 2016-02-04 VITALS — BP 104/71 | HR 80 | Temp 98.3°F | Resp 16 | Ht 67.0 in | Wt 321.0 lb

## 2016-02-04 DIAGNOSIS — R52 Pain, unspecified: Secondary | ICD-10-CM

## 2016-02-04 DIAGNOSIS — F411 Generalized anxiety disorder: Secondary | ICD-10-CM

## 2016-02-04 DIAGNOSIS — J029 Acute pharyngitis, unspecified: Secondary | ICD-10-CM

## 2016-02-04 LAB — POCT RAPID STREP A (OFFICE): RAPID STREP A SCREEN: NEGATIVE

## 2016-02-04 MED ORDER — DULOXETINE HCL 60 MG PO CPEP: 60.0000 mg | ORAL_CAPSULE | Freq: Every day | ORAL | Status: DC

## 2016-02-04 MED ORDER — ACETAMINOPHEN-CODEINE #3 300-30 MG PO TABS: 1.0000 | ORAL_TABLET | Freq: Every evening | ORAL | Status: DC | PRN

## 2016-02-04 MED FILL — DULoxetine HCL 60 MG CPEP: 60 | 30 days supply | Qty: 30 | Fill #0

## 2016-02-04 MED FILL — ACETAMINOPHEN/COD #3 TABLET: 300-30 | 30 days supply | Qty: 60 | Fill #0

## 2016-02-04 NOTE — Progress Notes (Signed)
F/U Body ache C/C diarrhea and sore throat  Body ache no changes since last visit  Pain scale #5 Tobacco user OCC No suicidal thought in the past two weeks

## 2016-02-04 NOTE — Patient Instructions (Addendum)
Lisa Lindsey was seen today for joint pain, generalized body aches and sore throat.  Diagnoses and all orders for this visit:  Body aches -     DULoxetine (CYMBALTA) 60 MG capsule; Take 1 capsule (60 mg total) by mouth daily. -     acetaminophen-codeine (TYLENOL #3) 300-30 MG tablet; Take 1-2 tablets by mouth at bedtime as needed for moderate pain. -     DG Knee Bilateral Standing AP; Future  Sore throat -     POCT rapid strep A -     Culture, Group A Strep  Generalized anxiety disorder -     DULoxetine (CYMBALTA) 60 MG capsule; Take 1 capsule (60 mg total) by mouth daily.   Rapid strep negative  F/u in 6 weeks for body aches and vit D check   Dr. Armen Pickup

## 2016-02-04 NOTE — Assessment & Plan Note (Signed)
A; body aches persistent P: Increase cymbalta

## 2016-02-04 NOTE — Assessment & Plan Note (Signed)
Sore throat following strep, treated   Rapid strep negative

## 2016-02-04 NOTE — Progress Notes (Signed)
Depression screen [facility] 2/9 02/04/2016 09/17/2015 07/13/2015 06/14/2015 06/14/2015  Decreased Interest 0 1 0 0 0  Down, Depressed, Hopeless 0 0 PHQ - 2 Score 0 Altered sleeping 1 - 0 - 0  Tired, decreased energy 1 - 1 - 1  Change in appetite 0 - 0 - 1  Feeling bad or failure about yourself  0 - 0 - 0  Trouble concentrating 2 - 1 - 0  Moving slowly or fidgety/restless 0 - 0 - 0  Suicidal thoughts 0 - 0 - 0  PHQ-9 Score 4 - 3 - 3    GAD 7 : Generalized Anxiety Score 02/04/2016  Nervous, Anxious, on Edge 2  Control/stop worrying 1  Worry too much - different things 1  Trouble relaxing 1  Restless 0  Easily annoyed or irritable 2  Afraid - awful might happen 0  Total GAD 7 Score 7

## 2016-02-04 NOTE — Progress Notes (Signed)
Subjective:  Patient ID: Lisa Lindsey, female    DOB: 1981/10/13  Age: 35 y.o. MRN: [id number]  CC: Joint Pain; Generalized Body Aches; and Sore Throat   HPI [name] presents for   1. Joint aches from shoulders down: persistent. Slight improvement with Cymbalta. Has gained weight. Has swelling in knees. She is planning to start weight watches. She has pain at the end of the day.   2. Sore throat: recently treated for strep. No fever or chills. Still with sore throat.    Social History  Substance Use Topics  . Smoking status: Never Smoker   . Smokeless tobacco: Not on file  . Alcohol Use: No   Outpatient Prescriptions Prior to Visit  Medication Sig Dispense Refill  . DULoxetine (CYMBALTA) 30 MG capsule Take 1 capsule (30 mg total) by mouth daily. 30 capsule 3  . levonorgestrel (MIRENA) 20 MCG/24HR IUD 1 each by Intrauterine route once. June 2015    . Vitamin D, Ergocalciferol, (DRISDOL) 50000 UNITS CAPS capsule Take 1 capsule (50,000 Units total) by mouth every 7 (seven) days. For 8 weeks 8 capsule 0   No facility-administered medications prior to visit.    ROS Review of Systems  Constitutional: Negative for fever and chills.  HENT: Positive for sore throat.   Eyes: Negative for visual disturbance.  Respiratory: Negative for shortness of breath.   Cardiovascular: Negative for chest pain.  Gastrointestinal: Negative for abdominal pain and blood in stool.  Musculoskeletal: Positive for myalgias, back pain, joint swelling and arthralgias.  Skin: Negative for rash.  Allergic/Immunologic: Negative for immunocompromised state.  Neurological: Positive for numbness.  Hematological: Negative for adenopathy. Does not bruise/bleed easily.  Psychiatric/Behavioral: Negative for suicidal ideas and dysphoric mood. The patient is nervous/anxious.     Objective:  BP 104/71 mmHg  Pulse 80  Temp(Src) 98.3 F (36.8 C) (Oral)  Resp 16  Ht  (1.702 m)  Wt 321 lb (145.605 kg)   BMI 50.26 kg/m2  SpO2 99%  BP/Weight 02/04/2016 09/17/2015 07/13/2015  Systolic BP 104 109 114  Diastolic BP 71 74 77  Wt. (Lbs) 321 308 300  BMI 50.26 48.23 46.98   Physical Exam  Constitutional: She is oriented to person, place, and time. She appears well-developed and well-nourished. No distress.  Obese   HENT:  Head: Normocephalic and atraumatic.  Cardiovascular: Normal rate, regular rhythm, normal heart sounds and intact distal pulses.   Pulmonary/Chest: Effort normal and breath sounds normal.  Musculoskeletal: She exhibits no edema.  Neurological: She is alert and oriented to person, place, and time.  Skin: Skin is warm and dry. No rash noted.  Psychiatric: She has a normal mood and affect.   Lab Results  Component Value Date   HGBA1C 5.30 09/17/2015   Rapid strep: negative   Depression screen [facility] 2/9 02/04/2016 09/17/2015 07/13/2015  Decreased Interest 0 1 0  Down, Depressed, Hopeless 0 0 1  PHQ - 2 Score 0 1 1  Altered sleeping 1 - 0  Tired, decreased energy 1 - 1  Change in appetite 0 - 0  Feeling bad or failure about yourself  0 - 0  Trouble concentrating 2 - 1  Moving slowly or fidgety/restless 0 - 0  Suicidal thoughts 0 - 0  PHQ-9 Score 4 - 3   GAD 7 : Generalized Anxiety Score 02/04/2016  Nervous, Anxious, on Edge 2  Control/stop worrying 1  Worry too much - different things 1  Trouble relaxing 1  Restless 0  Easily annoyed or irritable 2  Afraid - awful might happen 0  Total GAD 7 Score 7   Assessment & Plan:   Metta was seen today for joint pain, generalized body aches and sore throat.  Diagnoses and all orders for this visit:  Body aches -     DULoxetine (CYMBALTA) 60 MG capsule; Take 1 capsule (60 mg total) by mouth daily. -     acetaminophen-codeine (TYLENOL #3) 300-30 MG tablet; Take 1-2 tablets by mouth at bedtime as needed for moderate pain. -     DG Knee Bilateral Standing AP; Future  Sore throat -     POCT rapid strep A -     Culture,  Group A Strep  Generalized anxiety disorder -     DULoxetine (CYMBALTA) 60 MG capsule; Take 1 capsule (60 mg total) by mouth daily.   Follow-up: No Follow-up on file.   Dessa Phi MD

## 2016-02-06 LAB — CULTURE, GROUP A STREP

## 2016-02-11 ENCOUNTER — Telehealth: Payer: Self-pay | Admitting: *Deleted

## 2016-02-11 NOTE — Telephone Encounter (Signed)
-----   Message from Dessa PhiJosalyn Funches, MD sent at 02/06/2016  9:43 AM EST ----- Strep culture is positive Patient has been recently treated, no fever, swollen neck lymph nodes or exudate on tonsil to justify re-treating If patient develops fever or worsening sore throat, please call for re-treatment

## 2016-02-11 NOTE — Telephone Encounter (Signed)
Date of birth verified by pt  Result given, positive strep  Pt stated doing good, still with scratchy throat

## 2016-02-12 ENCOUNTER — Ambulatory Visit (HOSPITAL_COMMUNITY)
Admission: RE | Admit: 2016-02-12 | Discharge: 2016-02-12 | Disposition: A | Discharge status: Home / Self Care | Payer: Medicaid Other | Source: Ambulatory Visit | Attending: Family Medicine | Admitting: Family Medicine

## 2016-02-12 DIAGNOSIS — R52 Pain, unspecified: Secondary | ICD-10-CM

## 2016-02-12 DIAGNOSIS — M25561 Pain in right knee: Secondary | ICD-10-CM | POA: Insufficient documentation

## 2016-02-12 DIAGNOSIS — M25562 Pain in left knee: Secondary | ICD-10-CM | POA: Insufficient documentation

## 2016-02-12 DIAGNOSIS — M170 Bilateral primary osteoarthritis of knee: Secondary | ICD-10-CM | POA: Insufficient documentation

## 2016-02-13 ENCOUNTER — Telehealth: Payer: Self-pay | Admitting: *Deleted

## 2016-02-13 NOTE — Telephone Encounter (Signed)
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

## 2016-02-13 NOTE — Telephone Encounter (Signed)
LVM to return call.

## 2016-02-18 ENCOUNTER — Telehealth: Payer: Self-pay | Admitting: Family Medicine

## 2016-02-18 NOTE — Telephone Encounter (Signed)
Patient is calling in regards to Xray's taken last Tuesday on the 7th of March....please follow up with patient regarding results

## 2016-02-18 NOTE — Telephone Encounter (Signed)
Date of birth verified by pt  Xray results given  Plan for Wt loss and pain control  Pt verbalized understanding

## 2016-06-06 MED FILL — DULoxetine HCL 60 MG CPEP: 60 | 30 days supply | Qty: 30 | Fill #1

## 2016-10-07 ENCOUNTER — Other Ambulatory Visit: Payer: Self-pay | Admitting: Family Medicine

## 2016-10-07 DIAGNOSIS — R52 Pain, unspecified: Secondary | ICD-10-CM

## 2016-10-24 ENCOUNTER — Encounter: Payer: Self-pay | Admitting: Family Medicine

## 2016-10-24 ENCOUNTER — Ambulatory Visit: Payer: Self-pay | Attending: Family Medicine | Admitting: Family Medicine

## 2016-10-24 VITALS — BP 117/83 | HR 81 | Temp 98.2°F | Ht 67.0 in | Wt 317.6 lb

## 2016-10-24 DIAGNOSIS — M25552 Pain in left hip: Secondary | ICD-10-CM | POA: Insufficient documentation

## 2016-10-24 DIAGNOSIS — R52 Pain, unspecified: Secondary | ICD-10-CM

## 2016-10-24 DIAGNOSIS — M25551 Pain in right hip: Secondary | ICD-10-CM | POA: Insufficient documentation

## 2016-10-24 DIAGNOSIS — F411 Generalized anxiety disorder: Secondary | ICD-10-CM | POA: Insufficient documentation

## 2016-10-24 MED ORDER — BUSPIRONE HCL 5 MG PO TABS: 5.0000 mg | ORAL_TABLET | Freq: Three times a day (TID) | ORAL | 2 refills | Status: AC

## 2016-10-24 MED ORDER — DULOXETINE HCL 60 MG PO CPEP: 60.0000 mg | ORAL_CAPSULE | Freq: Every day | ORAL | 3 refills | Status: AC

## 2016-10-24 MED ORDER — ACETAMINOPHEN-CODEINE #3 300-30 MG PO TABS: 1.0000 | ORAL_TABLET | Freq: Every evening | ORAL | 2 refills | Status: AC | PRN

## 2016-10-24 MED FILL — busPIRone HCL 5 MG TABS: 5 | 30 days supply | Qty: 90 | Fill #0

## 2016-10-24 MED FILL — ACETAMINOPHEN/COD #3 TABLET: 300-30 | 30 days supply | Qty: 60 | Fill #0

## 2016-10-24 NOTE — Progress Notes (Signed)
Pt is here today to follow up on anxiety. Pt states that medication was working in the beginning but it is no longer working.  Pt declined flu shot.

## 2016-10-24 NOTE — Progress Notes (Signed)
Subjective:    Patient ID: Lisa Lindsey, female    DOB: 09/07/1981, 35 y.o.   MRN: [id number] CC; generalized anxiety   HPI 35 yo F with no significant medical problems   1. Anxiety: feeling irritable and anxious since 07/2014. Mood is affecting work and personal relationship. She had a panic attack at work in 01/2015. Her panic attacks are associated with SOB. She has no personal or family history of mood disorder. She is caring for 3 children. 1 of her own and 2 of her boyfriends. Cymbalta helped initially but is now not helping as much. She is amenable to counseling. Her mood is placing a strain of her relationship wit her boyfriend.  2. Body aches: from hips down. 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 By Monday she has pain in her hips and legs. She has difficulty getting out of bed.   Social History  Substance Use Topics  . Smoking status: Never Smoker  . Smokeless tobacco: Not on file  . Alcohol use No   Review of Systems  Constitutional: Negative for chills and fever.  Eyes: Negative for visual disturbance.  Respiratory: Negative for shortness of breath.   Cardiovascular: Negative for chest pain.  Gastrointestinal: Negative for abdominal pain and blood in stool.  Musculoskeletal: Positive for arthralgias and myalgias. Negative for back pain.  Skin: Negative for rash.  Allergic/Immunologic: Negative for immunocompromised state.  Hematological: Negative for adenopathy. Does not bruise/bleed easily.  Psychiatric/Behavioral: Negative for dysphoric mood and suicidal ideas.       Objective:   Physical Exam  Constitutional: She is oriented to person, place, and time. She appears well-developed and well-nourished. No distress.  Morbidly obese   HENT:  Head: Normocephalic and atraumatic.  Cardiovascular: Normal rate, regular rhythm, normal heart sounds and intact distal pulses.   Pulmonary/Chest: Effort normal and breath sounds normal.  Musculoskeletal: She exhibits no edema.    Neurological: She is alert and oriented to person, place, and time. Coordination normal.  Skin: Skin is warm and dry. No rash noted.  Psychiatric: She has a normal mood and affect.   BP 117/83 (BP Location: Left Arm, Patient Position: Sitting, Cuff Size: Large)   Pulse 81   Temp 98.2 F (36.8 C) (Oral)   Ht 5\' 7"  (1.702 m)   Wt (!) 317 lb 9.6 oz (144.1 kg)   SpO2 100%   BMI 49.74 kg/m    [facility] 10/24/2016 02/04/2016 09/17/2015 07/13/2015 06/14/2015  Decreased Interest 1 0 1 0 0  Down, Depressed, Hopeless 1 0 0 1 1  PHQ - 2 Score 2 0 1 1 1   Altered sleeping 2 1 - 0 -  Tired, decreased energy 2 1 - 1 -  Change in appetite 1 0 - 0 -  Feeling bad or failure about yourself  1 0 - 0 -  Trouble concentrating 1 2 - 1 -  Moving slowly or fidgety/restless 1 0 - 0 -  Suicidal thoughts 0 0 - 0 -  PHQ-9 Score 10 4 - 3 -   GAD 7 : Generalized Anxiety Score 10/24/2016 02/04/2016  Nervous, Anxious, on Edge 3 2  Control/stop worrying 3 1  Worry too much - different things 3 1  Trouble relaxing 2 1  Restless 1 0  Easily annoyed or irritable 3 2  Afraid - awful might happen 2 0  Total GAD 7 Score 17 7      Assessment & Plan:  Shaconda was seen today for  anxiety.  Diagnoses and all orders for this visit:  Generalized anxiety disorder -     busPIRone (BUSPAR) 5 MG tablet; Take 1 tablet (5 mg total) by mouth 3 (three) times daily. -     DULoxetine (CYMBALTA) 60 MG capsule; Take 1 capsule (60 mg total) by mouth daily.  Body aches -     DULoxetine (CYMBALTA) 60 MG capsule; Take 1 capsule (60 mg total) by mouth daily. -     acetaminophen-codeine (TYLENOL #3) 300-30 MG tablet; Take 1-2 tablets by mouth at bedtime as needed for moderate pain.

## 2016-10-24 NOTE — Patient Instructions (Addendum)
Evalin was seen today for anxiety.  Diagnoses and all orders for this visit:  Generalized anxiety disorder -     busPIRone (BUSPAR) 5 MG tablet; Take 1 tablet (5 mg total) by mouth 3 (three) times daily. -     DULoxetine (CYMBALTA) 60 MG capsule; Take 1 capsule (60 mg total) by mouth daily.  Body aches -     DULoxetine (CYMBALTA) 60 MG capsule; Take 1 capsule (60 mg total) by mouth daily. -     acetaminophen-codeine (TYLENOL #3) 300-30 MG tablet; Take 1-2 tablets by mouth at bedtime as needed for moderate pain.  start buspar take 5 mg twice daily for first 1-2 weeks Then 3 times a day  F/u in 6 weeks for anxiety   Dr. Armen PickupFunches

## 2016-10-25 NOTE — Assessment & Plan Note (Signed)
Pain on lower body Obesity  Plan: Tylenol #3 for pain control Advised ketogenic diet for weight loss

## 2016-10-25 NOTE — Assessment & Plan Note (Signed)
Worsening of chronic anxiety Continue cymbalta Add buspar Patient referred to counseling services

## 2016-11-21 ENCOUNTER — Ambulatory Visit (HOSPITAL_COMMUNITY)
Admission: EM | Admit: 2016-11-21 | Discharge: 2016-11-21 | Disposition: A | Discharge status: Home / Self Care | Payer: Self-pay | Attending: Internal Medicine | Admitting: Internal Medicine

## 2016-11-21 ENCOUNTER — Encounter (HOSPITAL_COMMUNITY): Payer: Self-pay | Admitting: Emergency Medicine

## 2016-11-21 DIAGNOSIS — N63 Unspecified lump in unspecified breast: Secondary | ICD-10-CM

## 2016-11-21 NOTE — ED Triage Notes (Signed)
Pt reports mass on left breast onset this am associated w/mild pain  Noticed it this am when she was taking a shower   Denies nipple drainage   A&O x4... NAD

## 2016-11-21 NOTE — ED Provider Notes (Signed)
CSN: [id number]     Arrival date & time 11/21/16  1807 History   First MD Initiated Contact with Patient 11/21/16 1843     Chief Complaint  Patient presents with  . Breast Mass   (Consider location/radiation/quality/duration/timing/severity/associated sxs/prior Treatment) 35 yo presents with a left breast mass found this morning while taking a shower. She called her PCP and they told her to come here. It is non-painful, but "large". She reports no erythema in the breast. No past history and no known family history. She currently has an IUD and does not ovulate.       Past Medical History:  Diagnosis Date  . Anxiety    Past Surgical History:  Procedure Laterality Date  . CESAREAN SECTION    . COLONOSCOPY     Family History  Problem Relation Age of Onset  . Diabetes Mother   . Hyperlipidemia Mother   . Hypertension Mother   . Heart disease Mother   . Kidney disease Mother   . COPD Mother   . Diabetes Father   . Heart disease Father    Social History  Substance Use Topics  . Smoking status: Never Smoker  . Smokeless tobacco: Never Used  . Alcohol use No   OB History    No data available     Review of Systems  All other systems reviewed and are negative.   Allergies  Penicillins  Home Medications   Prior to Admission medications   Medication Sig Start Date End Date Taking? Authorizing Provider  busPIRone (BUSPAR) 5 MG tablet Take 1 tablet (5 mg total) by mouth 3 (three) times daily. 10/24/16  Yes Josalyn Funches, MD  DULoxetine (CYMBALTA) 60 MG capsule Take 1 capsule (60 mg total) by mouth daily. 10/24/16  Yes Dessa PhiJosalyn Funches, MD  levonorgestrel (MIRENA) 20 MCG/24HR IUD 1 each by Intrauterine route once. June 2015   Yes Historical Provider, MD  acetaminophen-codeine (TYLENOL #3) 300-30 MG tablet Take 1-2 tablets by mouth at bedtime as needed for moderate pain. 10/24/16   Dessa PhiJosalyn Funches, MD   Meds Ordered and Administered this Visit  Medications - No data to  display  BP 110/61 (BP Location: Left Arm)   Pulse 74   Temp 98.2 F (36.8 C) (Oral)   Resp 18   SpO2 100%  No data found.   Physical Exam  Constitutional: She is oriented to person, place, and time. She appears well-developed and well-nourished. No distress.  Genitourinary:  Genitourinary Comments: Left breast normal appearing, without erythema or warmth. Some with cystic changes throughout. Left lateral quadrant 3 oclock position with firm but moveable mass, non-tender to palpation  Neurological: She is alert and oriented to person, place, and time.  Skin: Skin is warm and dry. She is not diaphoretic.  Psychiatric: Her behavior is normal.  Nursing note and vitals reviewed.   Urgent Care Course   Clinical Course     Procedures (including critical care time)  Labs Review Labs Reviewed - No data to display  Imaging Review No results found.   Visual Acuity Review  Right Eye Distance:   Left Eye Distance:   Bilateral Distance:    Right Eye Near:   Left Eye Near:    Bilateral Near:         MDM   1. Breast mass in female    Probable fibrocystic changes, though given firmness and size suggest she have a mammogram ordered through her PCP. It is suggested she call on Monday with  findings from today and request evaluation or order for mammogram. She expresses understanding.     Riki SheerMichelle G Eitan Doubleday, PA-C 11/21/16 1945

## 2016-11-21 NOTE — Discharge Instructions (Signed)
Call your PCP Monday and let them know you were seen here. We recommend a Mammogram be ordered. Ask if they can work you in or order this for you due to confirmed mass in the left breast. Good luck.

## 2016-11-24 ENCOUNTER — Telehealth: Payer: Self-pay | Admitting: Family Medicine

## 2016-11-24 DIAGNOSIS — N6321 Unspecified lump in the left breast, upper outer quadrant: Secondary | ICD-10-CM

## 2016-11-24 NOTE — Telephone Encounter (Signed)
Pt. Called stating she went to urgent care and the doctor found a a mass in her breast. Pt. States the doctor told her to call her PCP and have [facility]. Please f/u

## 2016-11-26 NOTE — Telephone Encounter (Signed)
Will route to PCP 

## 2016-11-26 NOTE — Telephone Encounter (Signed)
Pt. Called requesting to speak with her nurse regarding the mass that was found in her breast.  Please f/u with pt.

## 2016-11-27 NOTE — Telephone Encounter (Signed)
Diagnostic mammogram and ultrasound ordered Please schedule and call patient

## 2016-12-03 NOTE — Telephone Encounter (Signed)
Contacted the breast center to schedule ap[pointment, I was told to call [phone] to schedule appointment. When called I was informed that pt has to call and schedule her own appointment.

## 2016-12-15 ENCOUNTER — Telehealth: Payer: Self-pay | Admitting: Family Medicine

## 2016-12-15 NOTE — Telephone Encounter (Signed)
Patient called the office to speak with PCP regarding the mass that she has on her breast. Pt hasn't gotten a call back and needs to know what to do. I informed pt that she needs to call the breast center but states that she needs a referral. Please follow up.   Thank you.

## 2016-12-17 NOTE — Telephone Encounter (Signed)
Pt was called and informed to call [phone] to schedule her breast ultrasound.

## 2016-12-19 ENCOUNTER — Other Ambulatory Visit (HOSPITAL_COMMUNITY): Payer: Self-pay | Admitting: *Deleted

## 2016-12-19 DIAGNOSIS — N6320 Unspecified lump in the left breast, unspecified quadrant: Secondary | ICD-10-CM

## 2016-12-30 ENCOUNTER — Other Ambulatory Visit (HOSPITAL_COMMUNITY): Payer: Self-pay | Admitting: Obstetrics and Gynecology

## 2016-12-30 ENCOUNTER — Ambulatory Visit
Admission: RE | Admit: 2016-12-30 | Discharge: 2016-12-30 | Disposition: A | Discharge status: Home / Self Care | Payer: No Typology Code available for payment source | Source: Ambulatory Visit | Attending: Obstetrics and Gynecology | Admitting: Obstetrics and Gynecology

## 2016-12-30 ENCOUNTER — Encounter (HOSPITAL_COMMUNITY): Payer: Self-pay

## 2016-12-30 ENCOUNTER — Ambulatory Visit (HOSPITAL_COMMUNITY)
Admission: RE | Admit: 2016-12-30 | Discharge: 2016-12-30 | Disposition: A | Discharge status: Home / Self Care | Payer: Self-pay | Source: Ambulatory Visit | Attending: Obstetrics and Gynecology | Admitting: Obstetrics and Gynecology

## 2016-12-30 VITALS — BP 120/82 | Temp 97.7°F | Ht 67.0 in | Wt 319.0 lb

## 2016-12-30 DIAGNOSIS — N6320 Unspecified lump in the left breast, unspecified quadrant: Secondary | ICD-10-CM

## 2016-12-30 DIAGNOSIS — Z1239 Encounter for other screening for malignant neoplasm of breast: Secondary | ICD-10-CM

## 2016-12-30 DIAGNOSIS — N6325 Unspecified lump in the left breast, overlapping quadrants: Secondary | ICD-10-CM

## 2016-12-30 NOTE — Progress Notes (Signed)
Complaints of left breast lump x 2 months that is painful. Patient states the pain comes and goes. Patient rates the pain at a 1-2 out of 10.  Pap Smear:  Pap smear not completed today. [facility] and normal with negative HPV. Per patient has no history of an abnormal Pap smear. Last Pap smear result is in EPIC.  Physical exam: Breasts Breasts symmetrical. No skin abnormalities bilateral breasts. No nipple retraction bilateral breasts. No nipple discharge bilateral breasts. No lymphadenopathy. No lumps palpated right breast. Palpated a pea sized lump within the left breast at 3 o'clock 5 cm from the nipple. No complaints of pain or tenderness on exam. [facility] mamm[facility] and possible left breast ultrasound. Appointment scheduled for Tuesday, December 30, 2016 at 1100.        Pelvic/Bimanual No Pap smear completed today since last Pap smear and HPV typing was 04/06/2015. Pap smear not indicated per BCCCP guidelines.   Smoking History: Patient is a current smoker. Discussed smoking cessation with patient. Referred [facility] and gave resources to free smoking cessation classes at [facility].  Patient Navigation: Patient education provided. Access to services provided for [facility].

## 2016-12-30 NOTE — Patient Instructions (Signed)
Explained breast self awareness to [name]. Patient did not need a Pap smear today due to last Pap smear and HPV typing was 04/06/2015. Let her know BCCCP will cover Pap smears and HPV typing every 5 years unless has a history of abnormal Pap smears. [facility] for diagnostic mamm[facility] and possible left breast ultrasound. Appointment scheduled for Tuesday, December 30, 2016 at 1100. Discussed smoking cessation with patient. Referred patient to the [facility] and gave resources to free smoking cessation classes at [facility]. Lisa Lindsey verbalized understanding.  Lisa Lindsey, Lisa Maserhristine Poll, RN 12:50 PM

## 2017-01-02 ENCOUNTER — Encounter (HOSPITAL_COMMUNITY): Payer: Self-pay | Admitting: *Deleted

## 2017-01-12 ENCOUNTER — Other Ambulatory Visit: Payer: No Typology Code available for payment source

## 2017-02-25 ENCOUNTER — Other Ambulatory Visit: Payer: No Typology Code available for payment source

## 2017-04-22 ENCOUNTER — Encounter: Payer: Self-pay | Admitting: Family Medicine

## 2017-04-22 IMAGING — MG 2D DIGITAL DIAGNOSTIC BILATERAL MAMMOGRAM WITH CAD AND ADJUNCT T
8 of 14 series · 8 of 34 positions shown · non-contrast
Comparison: None.

CLINICAL DATA: Palpable lump within the left breast at the 3
o'clock axis.

This is patient's baseline mammogram.
EXAM:
2D DIGITAL DIAGNOSTIC BILATERAL MAMMOGRAM WITH CAD AND ADJUNCT TOMO
ULTRASOUND LEFT BREAST

[L TAN]
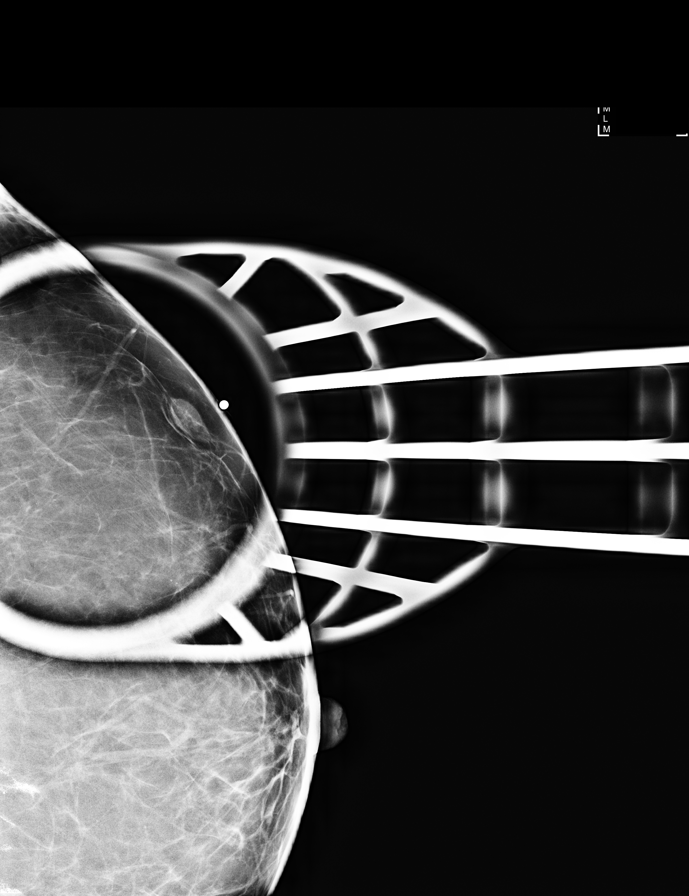

[R CC]
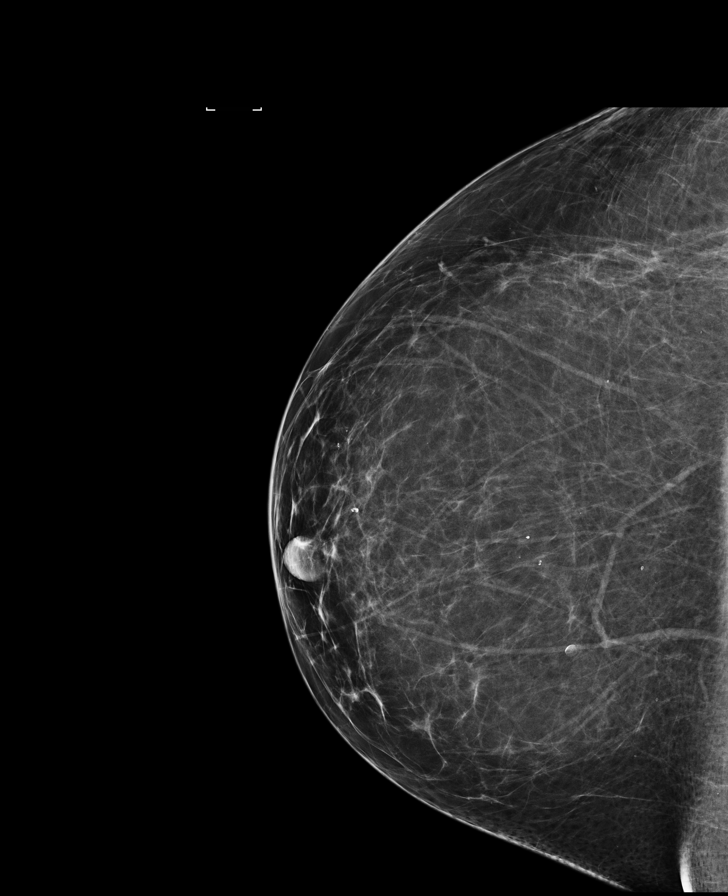

[R MLO synth-2D]
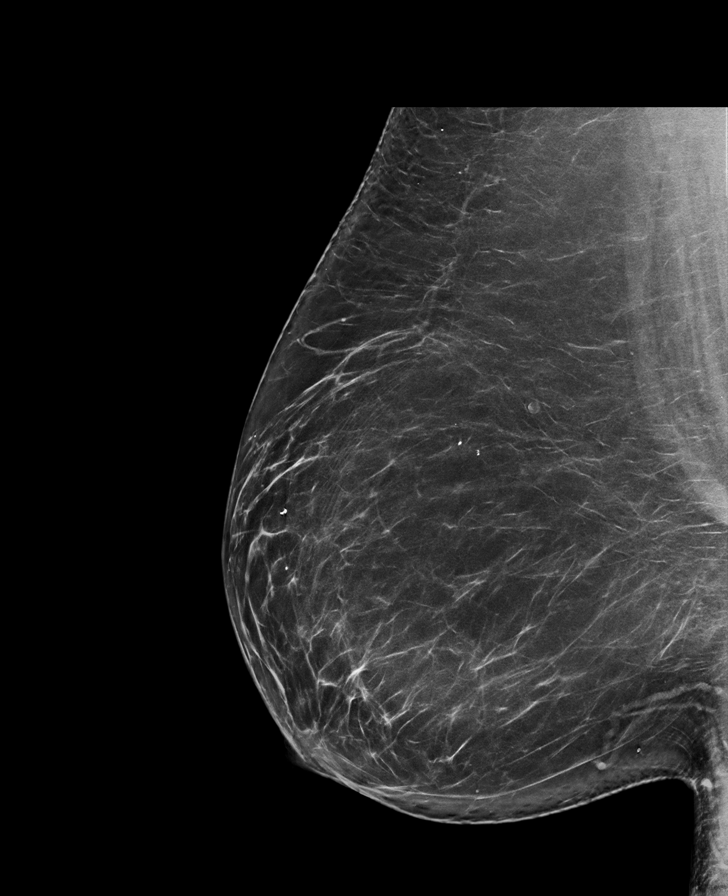

[R CC synth-2D]
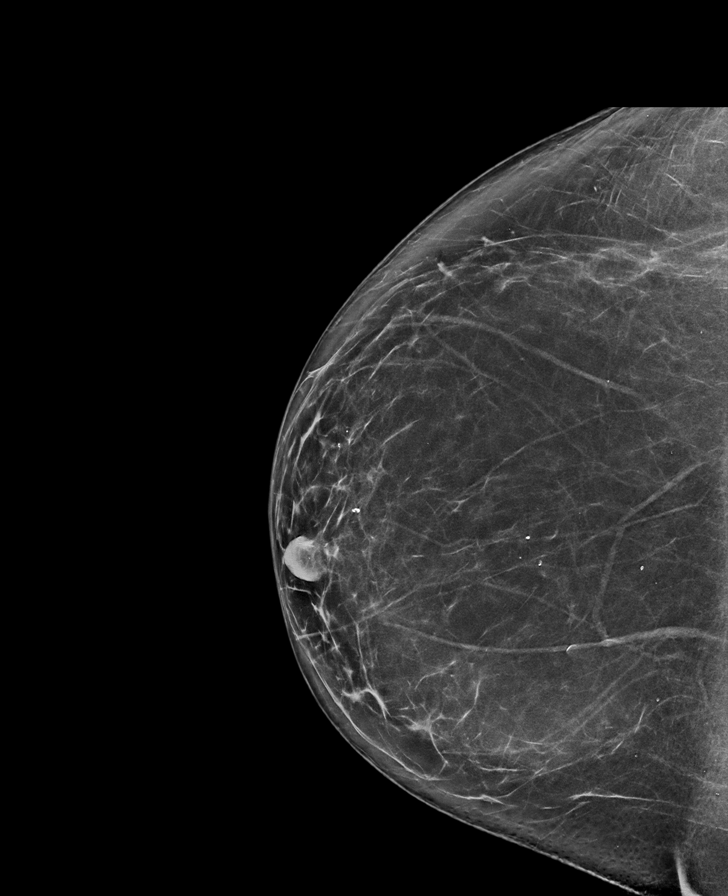

[R MLO]
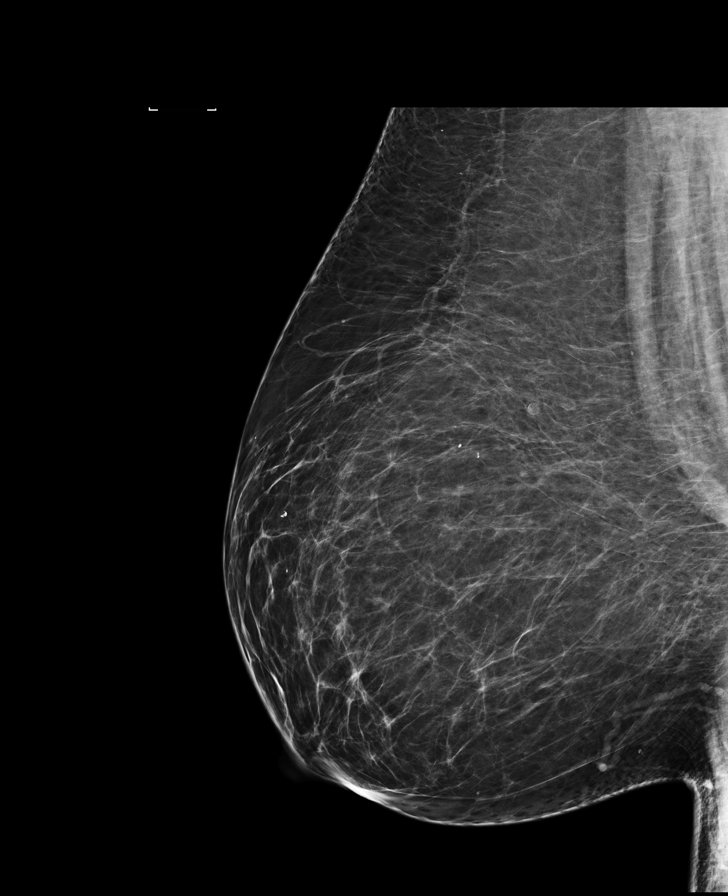

[L MLO synth-2D]
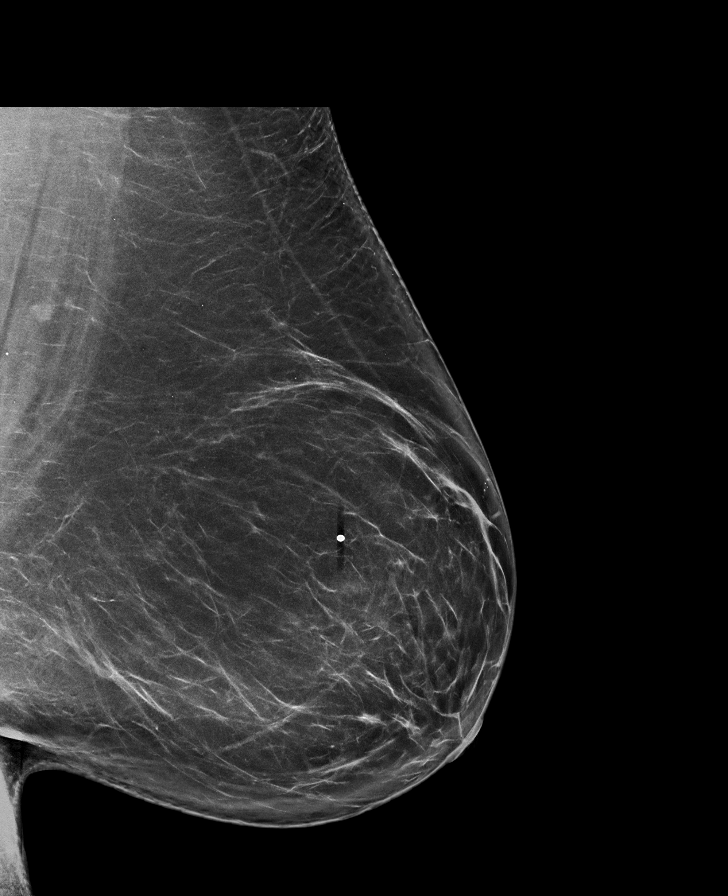

[L CC synth-2D]
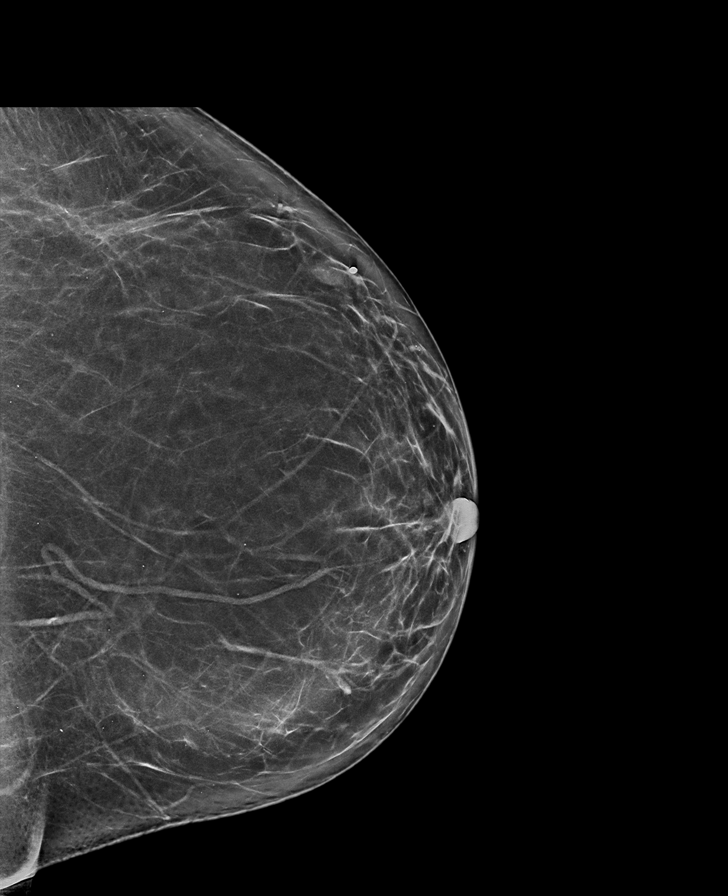

[L CC]
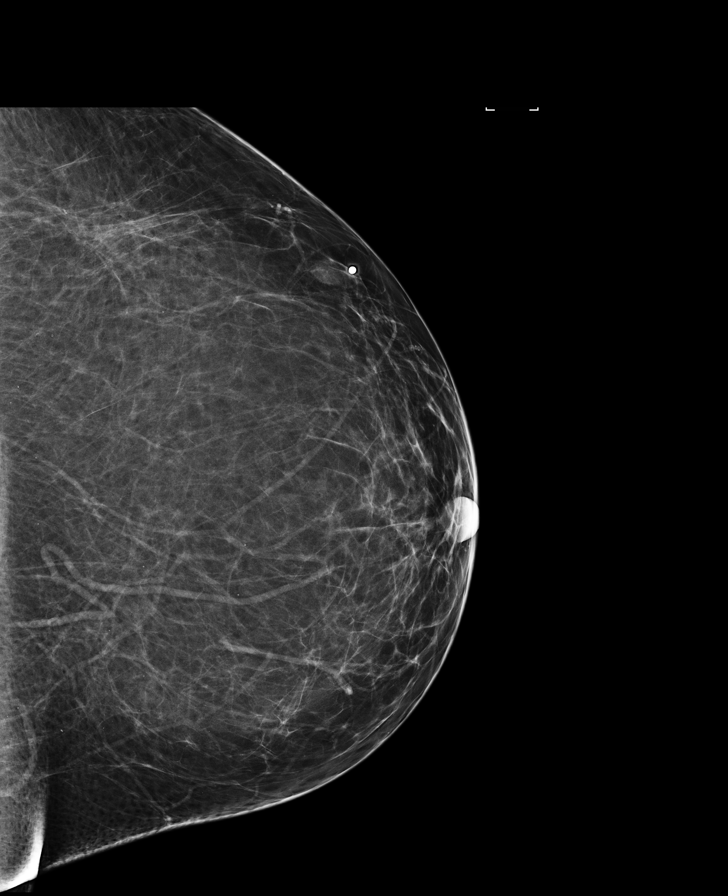

[8 of 34 positions shown; findings below may reference images not displayed]

ACR Breast Density Category b: There are scattered areas of
fibroglandular density.
FINDINGS: Bilateral 2D CC and MLO projections were obtained today, with
additional 3D tomosynthesis, and with additional spot compression
view of the outer left breast corresponding to the area of clinical
concern, with overlying skin marker in place.

There is an oval circumscribed mass within the outer left breast,
measuring approximately 1.2 cm greatest dimension, with internal fat
density, corresponding to the mammographic finding.

Mammographic images were processed with CAD.

Targeted ultrasound is performed, showing a mixed echogenicity
(predominately hyperechoic) mass within the left breast at the 3
o'clock axis, 5 cm from the nipple, measuring 1.7 x 0.5 x 1.2 cm,
avascular, most suggestive of benign fat necrosis, corresponding to
the area of clinical concern and mammographic finding.
IMPRESSION: Probably benign fat necrosis within the left breast at the 3 o'clock
axis, 5 cm from the nipple, measuring 1.7 x 0.5 x 1.2 cm. Recommend
follow-up left breast diagnostic mammogram and possible ultrasound
in 2 months to ensure an appropriate decrease in size or resolution.

RECOMMENDATION:
Follow-up left breast diagnostic mammogram and possible ultrasound
in 2 months.

I have discussed the findings and recommendations with the patient.
Results were also provided in writing at the conclusion of the
visit. If applicable, a reminder letter will be sent to the patient
regarding the next appointment.

BI-RADS CATEGORY  3: Probably benign.
